# Patient Record
Sex: Male | Born: 1980 | Race: White | Hispanic: No | Marital: Single | Smoking: Never smoker
Health system: Southern US, Community
[De-identification: ages and names within clinical notes are randomized; demographics above are authoritative.]

## PROBLEM LIST (undated history)

## (undated) DIAGNOSIS — E785 Hyperlipidemia, unspecified: Secondary | ICD-10-CM

## (undated) DIAGNOSIS — K219 Gastro-esophageal reflux disease without esophagitis: Secondary | ICD-10-CM

## (undated) DIAGNOSIS — F32A Depression, unspecified: Secondary | ICD-10-CM

## (undated) DIAGNOSIS — F329 Major depressive disorder, single episode, unspecified: Secondary | ICD-10-CM

## (undated) DIAGNOSIS — R0683 Snoring: Principal | ICD-10-CM

## (undated) DIAGNOSIS — F1010 Alcohol abuse, uncomplicated: Secondary | ICD-10-CM

## (undated) DIAGNOSIS — F1011 Alcohol abuse, in remission: Secondary | ICD-10-CM

## (undated) DIAGNOSIS — M1990 Unspecified osteoarthritis, unspecified site: Secondary | ICD-10-CM

## (undated) DIAGNOSIS — F419 Anxiety disorder, unspecified: Secondary | ICD-10-CM

## (undated) DIAGNOSIS — G4710 Hypersomnia, unspecified: Secondary | ICD-10-CM

## (undated) HISTORY — DX: Depression, unspecified: F32.A

## (undated) HISTORY — DX: Snoring: R06.83

## (undated) HISTORY — DX: Alcohol abuse, uncomplicated: F10.10

## (undated) HISTORY — DX: Anxiety disorder, unspecified: F41.9

## (undated) HISTORY — DX: Alcohol abuse, in remission: F10.11

## (undated) HISTORY — DX: Unspecified osteoarthritis, unspecified site: M19.90

## (undated) HISTORY — DX: Major depressive disorder, single episode, unspecified: F32.9

## (undated) HISTORY — DX: Hyperlipidemia, unspecified: E78.5

## (undated) HISTORY — DX: Hypersomnia, unspecified: G47.10

## (undated) HISTORY — DX: Gastro-esophageal reflux disease without esophagitis: K21.9

---

## 2002-11-18 ENCOUNTER — Ambulatory Visit (HOSPITAL_COMMUNITY): Admission: RE | Admit: 2002-11-18 | Discharge: 2002-11-18 | Payer: Self-pay | Admitting: Unknown Physician Specialty

## 2003-11-23 ENCOUNTER — Ambulatory Visit: Payer: Self-pay | Admitting: Gastroenterology

## 2006-07-21 ENCOUNTER — Ambulatory Visit (HOSPITAL_COMMUNITY): Admission: RE | Admit: 2006-07-21 | Discharge: 2006-07-21 | Payer: Self-pay | Admitting: *Deleted

## 2006-10-25 ENCOUNTER — Emergency Department (HOSPITAL_COMMUNITY): Admission: EM | Admit: 2006-10-25 | Discharge: 2006-10-25 | Payer: Self-pay | Admitting: Emergency Medicine

## 2010-08-07 ENCOUNTER — Encounter: Payer: Self-pay | Admitting: Gastroenterology

## 2010-09-11 ENCOUNTER — Encounter: Payer: Self-pay | Admitting: Gastroenterology

## 2010-09-11 ENCOUNTER — Ambulatory Visit (INDEPENDENT_AMBULATORY_CARE_PROVIDER_SITE_OTHER): Payer: BC Managed Care – PPO | Admitting: Gastroenterology

## 2010-09-11 ENCOUNTER — Other Ambulatory Visit (INDEPENDENT_AMBULATORY_CARE_PROVIDER_SITE_OTHER): Payer: BC Managed Care – PPO

## 2010-09-11 VITALS — BP 138/86 | HR 72 | Ht 72.0 in | Wt 227.0 lb

## 2010-09-11 DIAGNOSIS — R109 Unspecified abdominal pain: Secondary | ICD-10-CM

## 2010-09-11 DIAGNOSIS — K219 Gastro-esophageal reflux disease without esophagitis: Secondary | ICD-10-CM

## 2010-09-11 DIAGNOSIS — K921 Melena: Secondary | ICD-10-CM

## 2010-09-11 LAB — CBC WITH DIFFERENTIAL/PLATELET
Basophils Absolute: 0 10*3/uL (ref 0.0–0.1)
Basophils Relative: 0.4 % (ref 0.0–3.0)
Eosinophils Absolute: 0.4 10*3/uL (ref 0.0–0.7)
Eosinophils Relative: 3.8 % (ref 0.0–5.0)
HCT: 44 % (ref 39.0–52.0)
Hemoglobin: 14.8 g/dL (ref 13.0–17.0)
Lymphocytes Relative: 27.2 % (ref 12.0–46.0)
Lymphs Abs: 2.8 10*3/uL (ref 0.7–4.0)
MCHC: 33.6 g/dL (ref 30.0–36.0)
MCV: 90.9 fl (ref 78.0–100.0)
Monocytes Absolute: 0.8 10*3/uL (ref 0.1–1.0)
Monocytes Relative: 8 % (ref 3.0–12.0)
Neutro Abs: 6.3 10*3/uL (ref 1.4–7.7)
Neutrophils Relative %: 60.6 % (ref 43.0–77.0)
Platelets: 201 10*3/uL (ref 150.0–400.0)
RBC: 4.84 Mil/uL (ref 4.22–5.81)
RDW: 12.9 % (ref 11.5–14.6)
WBC: 10.4 10*3/uL (ref 4.5–10.5)

## 2010-09-11 LAB — BASIC METABOLIC PANEL
BUN: 10 mg/dL (ref 6–23)
CO2: 30 mEq/L (ref 19–32)
Calcium: 9.2 mg/dL (ref 8.4–10.5)
Chloride: 106 mEq/L (ref 96–112)
Creatinine, Ser: 1 mg/dL (ref 0.4–1.5)
GFR: 93.01 mL/min (ref >60.00)
Glucose, Bld: 93 mg/dL (ref 70–99)
Potassium: 4.2 mEq/L (ref 3.5–5.1)
Sodium: 143 mEq/L (ref 135–145)

## 2010-09-11 LAB — HEPATIC FUNCTION PANEL
ALT: 43 U/L (ref 0–53)
AST: 32 U/L (ref 0–37)
Albumin: 4.4 g/dL (ref 3.5–5.2)
Alkaline Phosphatase: 96 U/L (ref 39–117)
Bilirubin, Direct: 0.1 mg/dL (ref 0.0–0.3)
Total Bilirubin: 0.7 mg/dL (ref 0.3–1.2)
Total Protein: 7.7 g/dL (ref 6.0–8.3)

## 2010-09-11 LAB — TSH: TSH: 1.08 u[IU]/mL (ref 0.35–5.50)

## 2010-09-11 MED ORDER — GLYCOPYRROLATE 2 MG PO TABS: 2.0000 mg | ORAL_TABLET | Freq: Two times a day (BID) | ORAL | Status: DC

## 2010-09-11 NOTE — Patient Instructions (Addendum)
Go directly to the basement today, for your lab work. You have been scheduled for an Upper Endoscopy and Colonoscopy.  See separate sheet. High Fiber diet information given. SuPrep sample kit given. Your prescription has been sent to your pharmacy.

## 2010-09-11 NOTE — Progress Notes (Addendum)
History of Present Illness: This is a 30 year old male here today for several gastrointestinal complaints. He has a history of alcohol abuse since age 11. He states he has been sober now for approximately 18 months.  He relates a history of alternating diarrhea and constipation with frequent crampy midabdominal pain followed by urgent bowel movements. He has had these symptoms for about 20 years and they have not substantially changed over time. He also has had reflux problems for about 15 years and he has been maintained on omeprazole with good control of his symptoms.  He has occasional small amounts of bright red blood per rectum associated with bowel movements.  He underwent an abdominal ultrasound in 2008 for elevated liver function tests. The abdominal ultrasound was normal. He states his liver function tests returned to normal. He underwent a CT of the abdomen and pelvis in 2004 which was also normal.  Denies weight loss, change in stool caliber, melena, nausea, vomiting, dysphagia, chest pain.  Past Medical History  Diagnosis Date  . Anxiety and depression   . GERD (gastroesophageal reflux disease)   . Hyperlipemia   . Alcohol abuse   . Arthritis    No past surgical history on file.  reports that he has never smoked. He has quit using smokeless tobacco. His smokeless tobacco use included Chew. He reports that he does not drink alcohol or use illicit drugs. family history includes Heart disease in his maternal grandmother. No Known Allergies  Outpatient Encounter Prescriptions as of 09/11/2010  Medication Sig Dispense Refill  . atorvastatin (LIPITOR) 10 MG tablet Take 10 mg by mouth daily.        Marland Kitchen FLUoxetine (PROZAC) 10 MG capsule Take 10 mg by mouth daily.        Marland Kitchen glucosamine-chondroitin 500-400 MG tablet Take 2 tablets by mouth daily.        Marland Kitchen lithium carbonate 150 MG capsule Take 150 mg by mouth every other day.        Marland Kitchen omeprazole (PRILOSEC) 20 MG capsule Take 20 mg by mouth  daily.        Marland Kitchen glycopyrrolate (ROBINUL) 2 MG tablet Take 1 tablet (2 mg total) by mouth 2 (two) times daily.  60 tablet  11   Review of Systems: Pertinent positive and negative review of systems were noted in the above HPI section. All other review of systems were otherwise negative.  Physical Exam: General: Well developed , well nourished, no acute distress Head: Normocephalic and atraumatic Eyes:  sclerae anicteric, EOMI Ears: Normal auditory acuity Mouth: No deformity or lesions Neck: Supple, no masses or thyromegaly Lungs: Clear throughout to auscultation Heart: Regular rate and rhythm; no murmurs, rubs or bruits Abdomen: Soft, non tender and non distended. No masses, hepatosplenomegaly or hernias noted. Normal Bowel sounds Rectal: Deferred to colonoscopy Musculoskeletal: Symmetrical with no gross deformities  Skin: No lesions on visible extremities Pulses:  Normal pulses noted Extremities: No clubbing, cyanosis, edema or deformities noted Neurological: Alert oriented x 4, grossly nonfocal Cervical Nodes:  No significant cervical adenopathy Inguinal Nodes: No significant inguinal adenopathy Psychological:  Alert and cooperative. Normal mood and affect  Assessment and Recommendations:  1. Abdominal pain in multiple sites associated with urgent bowel movements, alternating diarrhea and constipation and small volume hematochezia. I suspect he has irritable bowel syndrome and a benign source of rectal bleeding such as hemorrhoids however inflammatory bowel disease, colorectal neoplasms need to be excluded. Substantially increase dietary fiber and water intake. Begin glycopyrrolate  2 mg twice daily. The risks, benefits, and alternatives to colonoscopy with possible biopsy and possible polypectomy were discussed with the patient and they consent to proceed.   2. GERD. Continue omeprazole and standard antireflux measures. Schedule upper endoscopy to rule out erosive esophagitis esophagus.  The risks, benefits, and alternatives to endoscopy with possible biopsy and possible dilation were discussed with the patient and they consent to proceed.   3. Alcoholism currently in recovery for 18 months.

## 2010-10-08 ENCOUNTER — Ambulatory Visit (AMBULATORY_SURGERY_CENTER): Payer: BC Managed Care – PPO | Admitting: Gastroenterology

## 2010-10-08 ENCOUNTER — Encounter: Payer: Self-pay | Admitting: Gastroenterology

## 2010-10-08 DIAGNOSIS — R109 Unspecified abdominal pain: Secondary | ICD-10-CM

## 2010-10-08 DIAGNOSIS — K921 Melena: Secondary | ICD-10-CM

## 2010-10-08 DIAGNOSIS — K219 Gastro-esophageal reflux disease without esophagitis: Secondary | ICD-10-CM

## 2010-10-08 MED ORDER — SODIUM CHLORIDE 0.9 % IV SOLN: 500.0000 mL | INTRAVENOUS | Status: DC

## 2010-10-08 NOTE — Progress Notes (Signed)
0856-Colonoscopy procedure finished. Transitioning pt to endoscopy position, pt refuses to open mouth to insert bite block. Pt arousable, pt nods head when asked to open mouth wide, pt does not open mouth but clenches teeth together. Vitals stable.  0900-Pt arousable, vitals stable, asked pt to open mouth to insert bite block, pt opened mouth, bite block inserted, sedation began, continued with procedure.

## 2010-10-09 ENCOUNTER — Telehealth: Payer: Self-pay | Admitting: *Deleted

## 2010-10-09 NOTE — Telephone Encounter (Signed)

## 2010-10-31 LAB — ETHANOL: Alcohol, Ethyl (B): 195 — ABNORMAL HIGH

## 2010-10-31 LAB — CBC
HCT: 46.2
Hemoglobin: 15.9
MCHC: 34.5
MCV: 89.4
Platelets: 215
RBC: 5.17
RDW: 13.3
WBC: 11.5 — ABNORMAL HIGH

## 2010-10-31 LAB — BASIC METABOLIC PANEL
BUN: 9
CO2: 25
Calcium: 9.5
Chloride: 108
Creatinine, Ser: 0.91
GFR calc Af Amer: >60
GFR calc non Af Amer: >60
Glucose, Bld: 117 — ABNORMAL HIGH
Potassium: 4.2
Sodium: 141

## 2010-10-31 LAB — URINALYSIS, ROUTINE W REFLEX MICROSCOPIC
Bilirubin Urine: NEGATIVE
Nitrite: NEGATIVE
Specific Gravity, Urine: <1.005 — ABNORMAL LOW
Urobilinogen, UA: 0.2
pH: 5.5

## 2010-10-31 LAB — DIFFERENTIAL
Basophils Absolute: 0
Basophils Relative: 0
Eosinophils Absolute: 0.3
Eosinophils Relative: 3
Lymphocytes Relative: 30
Lymphs Abs: 3.4 — ABNORMAL HIGH
Monocytes Absolute: 0.6
Monocytes Relative: 5
Neutro Abs: 7.1
Neutrophils Relative %: 62

## 2010-10-31 LAB — RAPID URINE DRUG SCREEN, HOSP PERFORMED
Amphetamines: NOT DETECTED
Opiates: NOT DETECTED
Tetrahydrocannabinol: NOT DETECTED

## 2012-01-21 HISTORY — PX: KNEE SURGERY: SHX244

## 2012-01-21 HISTORY — PX: MENISCUS REPAIR: SHX5179

## 2013-01-09 ENCOUNTER — Encounter (HOSPITAL_COMMUNITY): Payer: Self-pay | Admitting: Emergency Medicine

## 2013-01-09 ENCOUNTER — Emergency Department (HOSPITAL_COMMUNITY): Payer: BC Managed Care – PPO

## 2013-01-09 ENCOUNTER — Emergency Department (HOSPITAL_COMMUNITY)
Admission: EM | Admit: 2013-01-09 | Discharge: 2013-01-09 | Disposition: A | Discharge status: Home / Self Care | Payer: BC Managed Care – PPO | Attending: Emergency Medicine | Admitting: Emergency Medicine

## 2013-01-09 DIAGNOSIS — Z79899 Other long term (current) drug therapy: Secondary | ICD-10-CM | POA: Insufficient documentation

## 2013-01-09 DIAGNOSIS — R1032 Left lower quadrant pain: Secondary | ICD-10-CM | POA: Insufficient documentation

## 2013-01-09 DIAGNOSIS — F1010 Alcohol abuse, uncomplicated: Secondary | ICD-10-CM | POA: Insufficient documentation

## 2013-01-09 DIAGNOSIS — E785 Hyperlipidemia, unspecified: Secondary | ICD-10-CM | POA: Insufficient documentation

## 2013-01-09 DIAGNOSIS — F341 Dysthymic disorder: Secondary | ICD-10-CM | POA: Insufficient documentation

## 2013-01-09 DIAGNOSIS — M129 Arthropathy, unspecified: Secondary | ICD-10-CM | POA: Insufficient documentation

## 2013-01-09 DIAGNOSIS — K219 Gastro-esophageal reflux disease without esophagitis: Secondary | ICD-10-CM | POA: Insufficient documentation

## 2013-01-09 DIAGNOSIS — R110 Nausea: Secondary | ICD-10-CM | POA: Insufficient documentation

## 2013-01-09 DIAGNOSIS — R109 Unspecified abdominal pain: Secondary | ICD-10-CM

## 2013-01-09 LAB — CBC WITH DIFFERENTIAL/PLATELET
Basophils Absolute: 0 10*3/uL (ref 0.0–0.1)
Eosinophils Relative: 3 % (ref 0–5)
Lymphocytes Relative: 24 % (ref 12–46)
Lymphs Abs: 2.7 10*3/uL (ref 0.7–4.0)
MCV: 88.6 fL (ref 78.0–100.0)
Neutrophils Relative %: 64 % (ref 43–77)
Platelets: 196 10*3/uL (ref 150–400)
RBC: 4.83 MIL/uL (ref 4.22–5.81)
RDW: 12.4 % (ref 11.5–15.5)
WBC: 11 10*3/uL — ABNORMAL HIGH (ref 4.0–10.5)

## 2013-01-09 LAB — COMPREHENSIVE METABOLIC PANEL
ALT: 24 U/L (ref 0–53)
AST: 20 U/L (ref 0–37)
Albumin: 3.9 g/dL (ref 3.5–5.2)
Alkaline Phosphatase: 101 U/L (ref 39–117)
BUN: 14 mg/dL (ref 6–23)
CO2: 22 mEq/L (ref 19–32)
Calcium: 8.8 mg/dL (ref 8.4–10.5)
Chloride: 106 mEq/L (ref 96–112)
Creatinine, Ser: 1.12 mg/dL (ref 0.50–1.35)
GFR calc Af Amer: >90 mL/min (ref >90)
GFR calc non Af Amer: 84 mL/min — ABNORMAL LOW (ref >90)
Glucose, Bld: 99 mg/dL (ref 70–99)
Potassium: 3.8 mEq/L (ref 3.5–5.1)
Sodium: 140 mEq/L (ref 135–145)
Total Bilirubin: 0.3 mg/dL (ref 0.3–1.2)
Total Protein: 7.3 g/dL (ref 6.0–8.3)

## 2013-01-09 LAB — URINALYSIS, ROUTINE W REFLEX MICROSCOPIC
Leukocytes, UA: NEGATIVE
Nitrite: NEGATIVE
Protein, ur: NEGATIVE mg/dL
Specific Gravity, Urine: 1.027 (ref 1.005–1.030)
Urobilinogen, UA: 1 mg/dL (ref 0.0–1.0)

## 2013-01-09 LAB — LIPASE, BLOOD: Lipase: 25 U/L (ref 11–59)

## 2013-01-09 MED ORDER — HYDROMORPHONE HCL PF 1 MG/ML IJ SOLN
1.0000 mg | Freq: Once | INTRAMUSCULAR | Status: AC
  Administered 2013-01-09: 1 mg via INTRAVENOUS
  Filled 2013-01-09: qty 1

## 2013-01-09 MED ORDER — AMOXICILLIN-POT CLAVULANATE 875-125 MG PO TABS: 1.0000 | ORAL_TABLET | Freq: Two times a day (BID) | ORAL | Status: DC

## 2013-01-09 MED ORDER — ONDANSETRON HCL 4 MG/2ML IJ SOLN
4.0000 mg | Freq: Once | INTRAMUSCULAR | Status: AC
  Administered 2013-01-09: 4 mg via INTRAVENOUS
  Filled 2013-01-09: qty 2

## 2013-01-09 MED ORDER — OXYCODONE-ACETAMINOPHEN 5-325 MG PO TABS: 1.0000 | ORAL_TABLET | ORAL | Status: DC | PRN

## 2013-01-09 MED ORDER — IOHEXOL 300 MG/ML  SOLN
100.0000 mL | Freq: Once | INTRAMUSCULAR | Status: AC | PRN
  Administered 2013-01-09: 100 mL via INTRAVENOUS

## 2013-01-09 MED ORDER — MORPHINE SULFATE 4 MG/ML IJ SOLN
4.0000 mg | Freq: Once | INTRAMUSCULAR | Status: AC
  Administered 2013-01-09: 4 mg via INTRAVENOUS
  Filled 2013-01-09: qty 1

## 2013-01-09 MED ORDER — IOHEXOL 300 MG/ML  SOLN
25.0000 mL | INTRAMUSCULAR | Status: AC
  Administered 2013-01-09: 25 mL via ORAL

## 2013-01-09 NOTE — ED Provider Notes (Signed)
CSN: [id number]     Arrival date & time 01/09/13  1211 History   First MD Initiated Contact with Patient 01/09/13 1347     Chief Complaint  Patient presents with  . Flank Pain    Left flank   (Consider location/radiation/quality/duration/timing/severity/associated sxs/prior Treatment) HPI Comments: Pt state that he started with llq abdominal pain with some radiation to his left flank. No dysuria, vomiting,fever, diarrhea;no history of similar symptoms:pt denies history of stone:pt states that he came in today because the symptoms are worsening:nomal bm with some nausea  The history is provided by the patient. No language interpreter was used.    Past Medical History  Diagnosis Date  . Anxiety and depression   . GERD (gastroesophageal reflux disease)   . Hyperlipemia   . Alcohol abuse   . Arthritis    Past Surgical History  Procedure Laterality Date  . Knee surgery Left    Family History  Problem Relation Age of Onset  . Heart disease Maternal Grandmother    History  Substance Use Topics  . Smoking status: Never Smoker   . Smokeless tobacco: Current User    Types: Chew  . Alcohol Use: No     Comment: Alcohol abuse, recovering 18 months sober    Review of Systems  Constitutional: Negative.   Respiratory: Negative.   Cardiovascular: Negative.     Allergies  Review of patient's allergies indicates no known allergies.  Home Medications   Current Outpatient Rx  Name  Route  Sig  Dispense  Refill  . atorvastatin (LIPITOR) 10 MG tablet   Oral   Take 10 mg by mouth daily.           Marland Kitchen FLUoxetine (PROZAC) 10 MG capsule   Oral   Take 10 mg by mouth daily.           Marland Kitchen glucosamine-chondroitin 500-400 MG tablet   Oral   Take 2 tablets by mouth daily.           Marland Kitchen EXPIRED: glycopyrrolate (ROBINUL) 2 MG tablet   Oral   Take 1 tablet (2 mg total) by mouth 2 (two) times daily.   60 tablet   11   . lithium carbonate 150 MG capsule   Oral   Take 150 mg by  mouth every other day.           . Multiple Vitamin (MULTI VITAMIN MENS PO)   Oral   Take by mouth.           Marland Kitchen omeprazole (PRILOSEC) 20 MG capsule   Oral   Take 20 mg by mouth daily.            BP 137/93  Pulse 82  Temp(Src) 98 F (36.7 C) (Oral)  Resp 18  Wt 230 lb 4.8 oz (104.463 kg)  SpO2 96% Physical Exam  Nursing note and vitals reviewed. Constitutional: He is oriented to person, place, and time. He appears well-developed and well-nourished.  HENT:  Head: Normocephalic and atraumatic.  Cardiovascular: Normal rate and regular rhythm.   Pulmonary/Chest: Effort normal and breath sounds normal.  Abdominal: Soft. Bowel sounds are normal. There is tenderness in the left lower quadrant.  Musculoskeletal: Normal range of motion.  Neurological: He is alert and oriented to person, place, and time.  Skin: Skin is warm and dry.    ED Course  Procedures (including critical care time) Labs Review Labs Reviewed  URINALYSIS, ROUTINE W REFLEX MICROSCOPIC - Abnormal; Notable for the following:  Ketones, ur 15 (*)    All other components within normal limits  CBC WITH DIFFERENTIAL - Abnormal; Notable for the following:    WBC 11.0 (*)    All other components within normal limits  COMPREHENSIVE METABOLIC PANEL - Abnormal; Notable for the following:    GFR calc non Af Amer 84 (*)    All other components within normal limits  LIPASE, BLOOD   Imaging Review Ct Abdomen Pelvis W Contrast  01/09/2013   CLINICAL DATA:  Left flank and left lower quadrant pain.  EXAM: CT ABDOMEN AND PELVIS WITH CONTRAST  TECHNIQUE: Multidetector CT imaging of the abdomen and pelvis was performed using the standard protocol following bolus administration of intravenous contrast.  CONTRAST:  100 mL OMNIPAQUE IOHEXOL 300 MG/ML  SOLN  COMPARISON:  CT abdomen and pelvis 11/18/2002.  FINDINGS: The lung bases are clear. No pleural or pericardial effusion. Heart size is normal.  The gallbladder is  decompressed but otherwise unremarkable. The liver, spleen, adrenal glands, pancreas and kidneys appear normal. There is a small focus of infiltration of omental fat in the left lower quadrant most consistent with inflammatory change. The stomach, small and large bowel and appendix appear normal. There is no lymphadenopathy or fluid collection. No bony abnormality is identified.  IMPRESSION: Small focus of infiltration of omental fat in the left lower quadrant consistent with inflammatory change or less likely omental infarction. The examination is otherwise negative.   Electronically Signed   By: Drusilla Kanner M.D.   On: 01/09/2013 16:51    EKG Interpretation   None       MDM   1. Abdominal pain     Spoke with Dr. Derrell Lolling with surgery and he stated that we could treat with augmentin and have follow up with pcp    Teressa Lower, NP 01/09/13 1944

## 2013-01-09 NOTE — ED Provider Notes (Addendum)
Medical screening examination/treatment/procedure(s) were conducted as a shared visit with non-physician practitioner(s) and myself.  I personally evaluated the patient during the encounter.  EKG Interpretation   None       PAtient with LLQ abd pain. Tender but no signs of peritonitis. CT with inflammatory changes in omentum. D/W general surgery, advised antibiotics and analgesia.      Gilda Crease, MD 01/09/13 1945  Gilda Crease, MD 01/09/13 1946

## 2013-01-09 NOTE — ED Notes (Signed)
Pt c/o sharp left flank pain that moves to left low back and left lower quadrant of abdomen. Pt reports nausea and decrease appetite. Pt reports dark colored urine but no other symptoms.

## 2013-01-09 NOTE — ED Notes (Signed)
Patient finished with oral contrast.

## 2013-01-09 NOTE — ED Notes (Signed)
Patient returned from CT

## 2013-01-09 NOTE — ED Notes (Signed)
Patient transported to CT 

## 2013-09-16 ENCOUNTER — Encounter: Payer: Self-pay | Admitting: *Deleted

## 2013-09-19 ENCOUNTER — Ambulatory Visit (INDEPENDENT_AMBULATORY_CARE_PROVIDER_SITE_OTHER): Payer: BC Managed Care – PPO | Admitting: Neurology

## 2013-09-19 ENCOUNTER — Encounter: Payer: Self-pay | Admitting: Neurology

## 2013-09-19 VITALS — BP 126/83 | HR 69 | Resp 16 | Ht 72.5 in | Wt 229.0 lb

## 2013-09-19 DIAGNOSIS — R0683 Snoring: Secondary | ICD-10-CM

## 2013-09-19 DIAGNOSIS — R0609 Other forms of dyspnea: Secondary | ICD-10-CM

## 2013-09-19 DIAGNOSIS — G4733 Obstructive sleep apnea (adult) (pediatric): Secondary | ICD-10-CM

## 2013-09-19 DIAGNOSIS — R5381 Other malaise: Secondary | ICD-10-CM

## 2013-09-19 DIAGNOSIS — G4730 Sleep apnea, unspecified: Secondary | ICD-10-CM

## 2013-09-19 DIAGNOSIS — R0989 Other specified symptoms and signs involving the circulatory and respiratory systems: Secondary | ICD-10-CM

## 2013-09-19 DIAGNOSIS — R5383 Other fatigue: Secondary | ICD-10-CM

## 2013-09-19 DIAGNOSIS — G4710 Hypersomnia, unspecified: Secondary | ICD-10-CM

## 2013-09-19 DIAGNOSIS — F1011 Alcohol abuse, in remission: Secondary | ICD-10-CM

## 2013-09-19 HISTORY — DX: Snoring: R06.83

## 2013-09-19 HISTORY — DX: Hypersomnia, unspecified: G47.10

## 2013-09-19 HISTORY — DX: Alcohol abuse, in remission: F10.11

## 2013-09-19 MED ORDER — ZALEPLON 10 MG PO CAPS
10.0000 mg | ORAL_CAPSULE | Freq: Every evening | ORAL | Status: DC | PRN
Start: 2013-09-19 — End: 2017-11-23

## 2013-09-19 NOTE — Patient Instructions (Addendum)
Reduce Prozac to every other day before taking this sleep test ,  PSG and MSLT. If apnea is found , AHI of 10 or higher , the MSLT Polysomnography (Sleep Studies) Polysomnography (PSG) is a series of tests used for detecting (diagnosing) obstructive sleep apnea and other sleep disorders. The tests measure how some parts of your body are working while you are sleeping. The tests are extensive and expensive. They are done in a sleep lab or hospital, and vary from center to center. Your caregiver may perform other more simple sleep studies and questionnaires before doing more complete and involved testing. Testing may not be covered by insurance. Some of these tests are:  An EEG (Electroencephalogram). This tests your brain waves and stages of sleep.  An EOG (Electrooculogram). This measures the movements of your eyes. It detects periods of REM (rapid eye movement) sleep, which is your dream sleep.  An EKG (Electrocardiogram). This measures your heart rhythm.  EMG (Electromyography). This is a measurement of how the muscles are working in your upper airway and your legs while sleeping.  An oximetry measurement. It measures how much oxygen (air) you are getting while sleeping.  Breathing efforts may be measured. The same test can be interpreted (understood) differently by different caregivers and centers that study sleep.  Studies may be given an apnea/hypopnea index (AHI). This is a number which is found by counting the times of no breathing or under breathing during the night, and relating those numbers to the amount of time spent in bed. When the AHI is greater than 15, the patient is likely to complain of daytime sleepiness. When the AHI is greater than 30, the patient is at increased risk for heart problems and must be followed more closely. Following the AHI also allows you to know how treatment is working. Simple oximetry (tracking the amount of oxygen that is taken in) can be used for screening  patients who:  Do not have symptoms (problems) of OSA.  Have a normal Epworth Sleepiness Scale Score.  Have a low pre-test probability of having OSA.  Have none of the upper airway problems likely to cause apnea.  Oximetry is also used to determine if treatment is effective in patients who showed significant desaturations (not getting enough oxygen) on their home sleep study. One extra measure of safety is to perform additional studies for the person who only snores. This is because no one can predict with absolute certainty who will have OSA. Those who show significant desaturations (not getting enough oxygen) are recommended to have a more detailed sleep study. Document Released: 07/13/2002 Document Revised: 03/31/2011 Document Reviewed: 03/14/2013 [facility] Patient Information 2015 [place], [place]. This information is not intended to replace advice given to you by your health care provider. Make sure you discuss any questions you have with your health care provider. TEST wil not take place.

## 2013-09-19 NOTE — Progress Notes (Signed)
SLEEP MEDICINE CLINIC   Provider:  Melvyn Novas, M D  Referring Provider: Remus Loffler, PA-C Primary Care Physician:  Remus Loffler, PA-C  Chief Complaint  Patient presents with  . New Evaluation    Room 10  . Sleep consult    HPI:  Kevin Berg is a 33 y.o. male, who  is seen here as a referral  from Dr. Yetta Barre for a sleep apnea evaluation.  [name] reports that he has tired of being tired- he feels extremely fatigued. At work, he operates a Astronomer, so it is important for him to stay alert and awake doing the work hours.  He is a nonsmoker,  he does not drink alcohol,  he has been evaluated for metabolic disorder  and so far has not heard results of his last comprehensive metabolic panel which was drawn on 09-16-13. He also had a CBC drawn at the time. He carries the diagnosis of bipolar depression.  The patient has a supervisory position at [employer], and usually works the early shaft which starts at 7 but can and anytime between 3 and 7 PM. The patient's usual bedtime is between 9 and 10 PM, on average she has a very short sleep latency and sleeps usually through the night. There also some nights each week for he has trouble initiating sleep or sleeping restfully. He does still feel fatigued and not restored or refreshed in the morning nonetheless. His alarm is set at 5 AM and if he can afford it he likes to sleep an extra hour. He has not felt to be ready to go for month now. He feels extremely fatigued in the early afternoon and fights his desire to nap. He drinks 2 pots of coffee, but no energy drinks, Soda in daytime 20 ounces, iced tea on ocasion.  He has one bathroom break at night, he often is diaphoretic in the night, the bed is deranged from tossing and turning. He has vivid dreams and nightmares.  He often struggles with alcohol related dreams, that feel very real to him. No sleep paralysis.  He comes home form work at 4-5 PM , and he is  sleeping in a recliner to nap on week days 45 minutes, on weekends for 2 -3 hours. His wife has witnessed snoring and apneas, mostly in supine. He sleeps usually goes to sleep prone , wakes up on his back .   Review of Systems: Out of a complete 14 system review, the patient complains of only the following symptoms, and all other reviewed systems are negative.  Endorsed  were a sleepiness score at 21 points and the fatigue severity at 61 points. Is a Z scores very elevated. He for the endorsed snoring, joint pain, depression, anxiety, lost interest in activities and   has a history of depression, anxiety , high cholesterol and had a knee surgery in 2014. Depression score elated to ETOH abuse : he quit drinking 90 days ago. Was 3.5 years sober before this spring.      History   Social History  . Marital Status: Married    Spouse Name: Dois Berg    Number of Children: 2  . Years of Education: 12   Occupational History  . [employer]    Social History Main Topics  . Smoking status: Never Smoker   . Smokeless tobacco: Current User    Types: Chew  . Alcohol Use: No     Comment: Alcohol abuse,  recovering 18 months sober  . Drug Use: No  . Sexual Activity: Not on file   Other Topics Concern  . Not on file   Social History Narrative   Patient is married Dois Berg) and lives at home with his wife and two children.   Patient is working at [employer] Teacher, early years/pre).   Patient has a high school education.   Patient is right-handed.   Patient drinks two cups of coffee every morning, two 20 0z of sodas daily and tea not quite as often.    Family History  Problem Relation Age of Onset  . Heart disease Maternal Grandmother   . Diabetes Father   . Hypertension Father   . Hypertension Mother   . Arthritis Mother     Past Medical History  Diagnosis Date  . Anxiety and depression   . GERD (gastroesophageal reflux disease)   . Hyperlipemia   . Alcohol abuse   . Arthritis     Past  Surgical History  Procedure Laterality Date  . Knee surgery Left 2014    Current Outpatient Prescriptions  Medication Sig Dispense Refill  . atorvastatin (LIPITOR) 10 MG tablet Take 10 mg by mouth daily.        Marland Kitchen FLUoxetine (PROZAC) 20 MG capsule Take 20 mg by mouth daily.      Marland Kitchen glucosamine-chondroitin 500-400 MG tablet Take 2 tablets by mouth daily.        Marland Kitchen lithium carbonate 300 MG capsule Take 300 mg by mouth every other day.      . Multiple Vitamin (MULTI VITAMIN MENS PO) Take by mouth.        . naproxen sodium (ANAPROX) 220 MG tablet Take 440 mg by mouth 2 (two) times daily as needed (for pain).      Marland Kitchen omeprazole (PRILOSEC) 20 MG capsule Take 20 mg by mouth 2 (two) times daily before a meal.        No current facility-administered medications for this visit.    Allergies as of 09/19/2013  . (No Known Allergies)    Vitals: BP 126/83  Pulse 69  Resp 16  Ht 6' 0.5" (1.842 m)  Wt 229 lb (103.874 kg)  BMI 30.61 kg/m2 Last Weight:  Wt Readings from Last 1 Encounters:  09/19/13 229 lb (103.874 kg)       Last Height:   Ht Readings from Last 1 Encounters:  09/19/13 6' 0.5" (1.842 m)    Physical exam:  General: The patient is awake, alert and appears not in acute distress. The patient is well groomed. Head: Normocephalic, atraumatic. Neck is supple. Mallampati 3  neck circumference:17.5 . Nasal airflow unrestricted , TMJ is not  evident . Retrognathia is not seen.  Cardiovascular:  Regular rate and rhythm, without  murmurs or carotid bruit, and without distended neck veins. Respiratory: Lungs are clear to auscultation. Skin:  Without evidence of edema, or rash Trunk: BMI is elevated and patient has normal posture.  Neurologic exam : The patient is awake and alert, oriented to place and time.   Memory subjectivedescribed as intact. There is a normal attention span & concentration ability. Speech is fluent without  dysarthria, dysphonia or aphasia. Mood and affect are  appropriate.  Cranial nerves: Pupils are equal and briskly reactive to light. Funduscopic exam without  evidence of pallor or edema. Extraocular movements  in vertical and horizontal planes intact and without nystagmus. Visual fields by finger perimetry are intact. Hearing to finger rub intact.  Facial sensation  intact to fine touch. Facial motor strength is symmetric and tongue and uvula move midline.  Motor exam:   Normal tone ,muscle bulk and symmetric ,strength in all extremities.  Sensory:  Fine touch, pinprick and vibration were tested in all extremities. Proprioception is normal.  Coordination: Rapid alternating movements in the fingers/hands is normal. Finger-to-nose maneuver without evidence of ataxia, dysmetria or tremor.  Gait and station: Patient walks without assistive device and is able unassisted to climb up to the exam table.  Strength within normal limits. Stance is stable and normal.  Deep tendon reflexes: in the  upper and lower extremities are symmetric and intact.   Assessment:  After physical and neurologic examination, review of laboratory studies, imaging, neurophysiology testing and pre-existing records, assessment is   Snoring witnessed apneas, high Epworth and FSS , likely related to OSA in this patient, with a history of depression.    The patient was advised of the nature of the diagnosed sleep disorder , the treatment options and risks for general a health and wellness arising from not treating the condition. Visit duration was 45 minutes.   Plan:  Treatment plan and additional workup : SPLIT at AHI 15 and score at 3 % , Co2 if available.  Needs to bring his aleve to the lab.      Porfirio Mylar Rmoni Keplinger MD  09/19/2013

## 2013-10-27 ENCOUNTER — Ambulatory Visit (INDEPENDENT_AMBULATORY_CARE_PROVIDER_SITE_OTHER): Payer: BC Managed Care – PPO

## 2013-10-27 DIAGNOSIS — R5383 Other fatigue: Secondary | ICD-10-CM

## 2013-10-27 DIAGNOSIS — G4733 Obstructive sleep apnea (adult) (pediatric): Secondary | ICD-10-CM

## 2013-10-27 DIAGNOSIS — R5381 Other malaise: Secondary | ICD-10-CM

## 2013-10-27 DIAGNOSIS — G4730 Sleep apnea, unspecified: Secondary | ICD-10-CM

## 2013-10-27 DIAGNOSIS — R0683 Snoring: Secondary | ICD-10-CM

## 2013-10-27 DIAGNOSIS — G4710 Hypersomnia, unspecified: Secondary | ICD-10-CM

## 2013-10-28 DIAGNOSIS — G4710 Hypersomnia, unspecified: Secondary | ICD-10-CM

## 2013-11-10 ENCOUNTER — Telehealth: Payer: Self-pay | Admitting: *Deleted

## 2013-11-10 ENCOUNTER — Encounter: Payer: Self-pay | Admitting: *Deleted

## 2013-11-10 ENCOUNTER — Other Ambulatory Visit: Payer: Self-pay | Admitting: Neurology

## 2013-11-10 DIAGNOSIS — G4733 Obstructive sleep apnea (adult) (pediatric): Secondary | ICD-10-CM

## 2013-11-10 NOTE — Telephone Encounter (Signed)
Patient was contacted and provided the results of his NPSG and his subsequent MSLT.  Patient was given the option of having an overnight CPAP titration study or being directly referred to the DME for Auto-CPAP.  Patient opted to be directly referred to DME and wants to try using CPAP to see if it resolves his excessive daytime sleepiness.  Patient was mailed a copy of the results and a copy was faxed to Prudy FeelerAngel Jones, PA-C.   Patient instructed to contact our office 6-8 weeks post set up to schedule a follow up appointment.

## 2013-11-14 ENCOUNTER — Other Ambulatory Visit: Payer: Self-pay | Admitting: Neurology

## 2013-11-14 DIAGNOSIS — G4733 Obstructive sleep apnea (adult) (pediatric): Secondary | ICD-10-CM

## 2014-01-16 ENCOUNTER — Ambulatory Visit: Payer: BC Managed Care – PPO | Admitting: Neurology

## 2014-02-04 ENCOUNTER — Encounter: Payer: Self-pay | Admitting: Neurology

## 2014-02-16 ENCOUNTER — Encounter: Payer: Self-pay | Admitting: Neurology

## 2014-02-17 ENCOUNTER — Ambulatory Visit (INDEPENDENT_AMBULATORY_CARE_PROVIDER_SITE_OTHER): Payer: BLUE CROSS/BLUE SHIELD | Admitting: Neurology

## 2014-02-17 ENCOUNTER — Encounter: Payer: Self-pay | Admitting: Neurology

## 2014-02-17 VITALS — BP 122/82 | HR 73 | Resp 14 | Ht 71.0 in | Wt 229.5 lb

## 2014-02-17 DIAGNOSIS — G4730 Sleep apnea, unspecified: Secondary | ICD-10-CM

## 2014-02-17 DIAGNOSIS — G4710 Hypersomnia, unspecified: Secondary | ICD-10-CM

## 2014-02-17 NOTE — Progress Notes (Signed)
SLEEP MED[facility]INE CLINIC   Provider:  Melvyn Novas, M D  Referring Provider: Remus Loffler, PA-C Primary Care Physician:  Remus Loffler, PA-C  Chief Complaint  Patient presents with  . RV cpap    Rm 10, alone    HPI:  Kevin Berg is a 34 y.o. male, who  is seen here as a referral  from Dr. Yetta Barre for a sleep apnea evaluation.  [name] reports that he has tired of being tired- he feels extremely fatigued. At work, he operates a Astronomer, so it is important for him to stay alert and awake doing the work hours.  He is a nonsmoker,  he does not drink alcohol,  he has been evaluated for metabolic disorder  and so far has not heard results of his last comprehensive metabolic panel which was drawn on 09-16-13. He also had a CBC drawn at the time. He carries the diagnosis of bipolar depression.  The patient has a supervisory position at [employer], and usually works the early shaft which starts at 7 but can and anytime between 3 and 7 PM. The patient's usual bedtime is between 9 and 10 PM, on average she has a very short sleep latency and sleeps usually through the night. There also some nights each week for he has trouble initiating sleep or sleeping restfully. He does still feel fatigued and not restored or refreshed in the morning nonetheless. His alarm is set at 5 AM and if he can afford it he likes to sleep an extra hour. He has not felt to be ready to go for month now. He feels extremely fatigued in the early afternoon and fights his desire to nap. He drinks 2 pots of coffee, but no energy drinks, Soda in daytime 20 ounces, iced tea on ocasion.  He has one bathroom break at night, he often is diaphoretic in the night, the bed is deranged from tossing and turning. He has vivid dreams and nightmares.  He often struggles with alcohol related dreams, that feel very real to him. No sleep paralysis.  He comes home form work at 4-5 PM , and he is sleeping in a recliner to  nap on week days 45 minutes, on weekends for 2 -3 hours. His wife has witnessed snoring and apneas, mostly in supine. He sleeps usually goes to sleep prone , wakes up on his back .   1-20 9-16 [name] is here for his first revisit after diagnosis of obstructive sleep apnea. He underwent a polysomnography on 10-27-13 and was diagnosed with a very mild apnea and AHI of 6.4 but an RDI of 14.7 there was a supine component to his apnea as well as REM dependent apnea. The REM AHI was 19.2 the patient did not retain CO2 but had brief periods of October less oxygenation. 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 An O2 titration was recommended as well as the study was found to be valid to the followed by an MS LT his MS LT had a mean sleep latency of 5.9 no REM sleep onset was noted and he fell asleep in all 5 naps. The preceding total sleep time of the night was 401 minutes. This patient is certainly hypersomnia By the brief mean sleep latency and the frequency of going into naps in daytime. The diagnosis of narcolepsy however is usually relying on the onset of REM sleep in  one of these nap periods based on her clinical picture the diagnosis can still be made even if REM onset sleep was not noted. The patient was fitted with an outer titrate her between 5 and 12 cm water and an EPR level of 3 cm was set he has been 83% compliant which is excellent 25 out of 30 days all days was 5 hours and 42 minutes on average use of CPAP and his AHI has been reduced to 0.1. Therefore this patient's mild apnea is considered to be sufficiently treated CPAP is working well for him he endorsed today the Epworth sleepiness score at 8 points which is decreased and the fatigue severity score at 33 points which is also reduced. Based on these to test results I would think that the patient #1 does not have narcolepsy but had etiopathic or sleep apnea related  hypersomnia.  Treatment of his rather mild apnea allowed the patient's patient to have a decrease and fatigue and daytime sleepiness. The patient is compliant with CPAP use. He is allowed to use a sleep aid if needed at night.  Review of Systems:  Out of a complete 14 system review, the patient complains of only the following symptoms, and all other reviewed systems are negative.  Endorsed  were a sleepiness score at  8 from  21 points and the fatigue severity at  34 from 61 points.  He is drinking beer again,      History   Social History  . Marital Status: Married    Spouse Name: Dois DavenportSandra    Number of Children: 2  . Years of Education: 12   Occupational History  . [employer]    Social History Main Topics  . Smoking status: Never Smoker   . Smokeless tobacco: Current User    Types: Chew  . Alcohol Use: 1.8 - 2.4 oz/week    3-4 Not specified per week     Comment: Alcohol abuse, recovering 18 months sober (history)  . Drug Use: No  . Sexual Activity: Not on file   Other Topics Concern  . Not on file   Social History Narrative   Patient is married Dois Davenport( Sandra) and lives at home with his wife and two children.   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   Patient has a high school education.   Patient is right-handed.   Patient drinks two cups of coffee every morning, one 20 0z of sodas daily and tea not quite as often.    Family History  Problem Relation Age of Onset  . Heart disease Maternal Grandmother   . Diabetes Father   . Hypertension Father   . Hypertension Mother   . Arthritis Mother     Past Medical History  Diagnosis Date  . Anxiety and depression   . GERD (gastroesophageal reflux disease)   . Hyperlipemia   . Alcohol abuse   . Arthritis   . 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 Snorings 09/19/2013  . H/O ETOH abuse 09/19/2013    Past Surgical History  Procedure Laterality Date  . Knee surgery Left 2014    Current Outpatient Prescriptions    Medication Sig Dispense Refill  . atorvastatin (LIPITOR) 10 MG tablet Take 10 mg by mouth daily.      Marland Kitchen. FLUoxetine (PROZAC) 20 MG capsule Take 20 mg by mouth daily.    Marland Kitchen. lithium carbonate 300 MG capsule Take 300 mg by mouth every other day.    . Multiple Vitamin (MULTI  VITAMIN MENS PO) Take by mouth.      . naproxen sodium (ANAPROX) 220 MG tablet Take 440 mg by mouth 2 (two) times daily as needed (for pain).    Marland Kitchen omeprazole (PRILOSEC) 20 MG capsule Take 20 mg by mouth 2 (two) times daily before a meal.     . zaleplon (SONATA) 10 MG capsule Take 1 capsule (10 mg total) by mouth at bedtime as needed for sleep. 15 capsule 0   No current facility-administered medications for this visit.    Allergies as of 02/17/2014  . (No Known Allergies)    Vitals: BP 122/82 mmHg  Pulse 73  Resp 14  Ht  (1.803 m)  Wt 229 lb 8 oz (104.101 kg)  BMI 32.02 kg/m2 Last Weight:  Wt Readings from Last 1 Encounters:  02/17/14 229 lb 8 oz (104.101 kg)       Last Height:   Ht Readings from Last 1 Encounters:  02/17/14  (1.803 m)    Physical exam:  General: The patient is awake, alert and appears not in acute distress. The patient is well groomed. Head: Normocephalic, atraumatic. Neck is supple. Mallampati 3 - full facial hair.  neck circumference:17.5 . Nasal airflow unrestricted , TMJ is not  evident . Retrognathia is not seen.  Cardiovascular:  Regular rate and rhythm,  Respiratory: Lungs are clear to auscultation. Skin:  Without evidence of edema, or rash Trunk: BMI is elevated and patient has normal posture.  Neurologic exam : The patient is awake and alert, oriented to place and time.   Memory subjectivedescribed as intact.  Cranial nerves: Pupils are equal and briskly reactive to light. Hearing to finger rub intact.   Facial sensation intact to fine touch. Facial motor strength is symmetric and tongue and uvula move midline.  Motor exam:   Normal tone ,muscle bulk and symmetric  ,strength in all extremities. Gait and station: Patient walks without assistive device and is able unassisted to climb up to the exam table.  Strength within normal limits. Stance is stable and normal.  Deep tendon reflexes: in the  upper and lower extremities are symmetric and intact. Assessment:  After physical and neurologic examination, review of laboratory studies, imaging, neurophysiology testing and pre-existing records, assessment is   Mr. 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 His MS LT study was performed after a night of normal sleep and after he had weaned off Prozac and still did not show sleep REM onsets. His decrease in fatigue and Epworth sleepiness score from 20 01/21/2006 points documents the significance of CPAP treatment for this mild apnea patient. He has been very compliant and I encouraged him to continue using the CPAP. He seems not reminded his 95th percentile pressure is at 8.8 cm water as far as I'm concerned he can stay on the outer titrated. I have advised him to not use alcohol before bedtime as it can still cause interruption in his sleep plus it usually tends to worsen apnea and snoring.   The patient was advised of the nature of the diagnosed sleep disorder , the treatment options and risks for general a health and wellness arising from not treating the condition. Visit duration was 15 minutes.   Plan:   Rv once a Elwyn Lade Ezella Kell MD  02/17/2014

## 2014-08-18 IMAGING — CT CT ABD-PELV W/ CM
2 of 4 series · 16 of 46 positions shown, 18 images · IV contrast (APPLIED)
Comparison: CT abdomen and pelvis 11/18/2002.

CLINICAL DATA: Left flank and left lower quadrant pain.

EXAM:
CT ABDOMEN AND PELVIS WITH CONTRAST
TECHNIQUE: Multidetector CT imaging of the abdomen and pelvis was performed
using the standard protocol following bolus administration of
intravenous contrast.
CONTRAST:  100 mL OMNIPAQUE IOHEXOL 300 MG/ML  SOLN

[Series 2: abd/ pelvis 5.0 i30f 1 · axial · 0.83mm/px · z∈[-574,-84]mm · 13 of 108 slices shown, 15 images]
[im 5/108  soft-tissue]
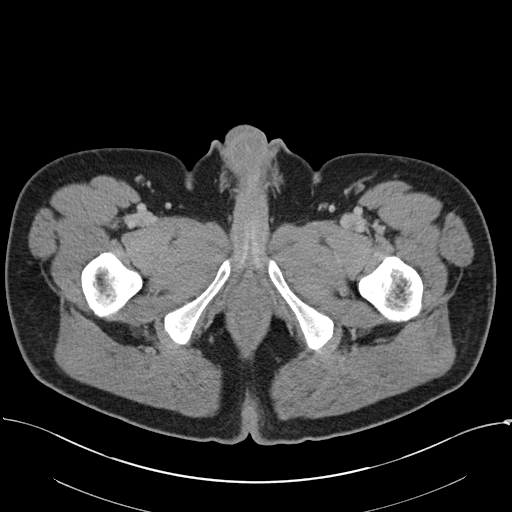
[im 5/108  bone]
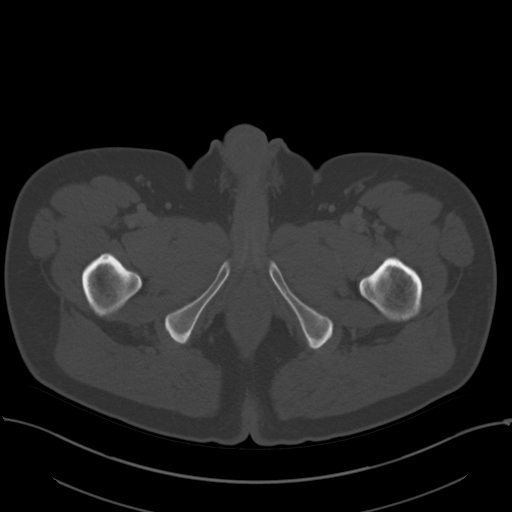
[im 14/108  soft-tissue]
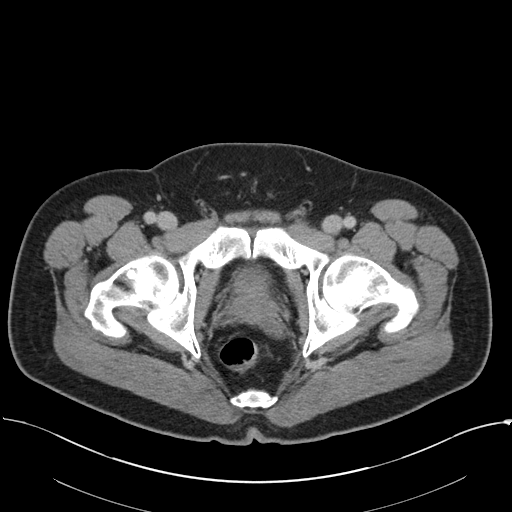
[im 23/108  soft-tissue]
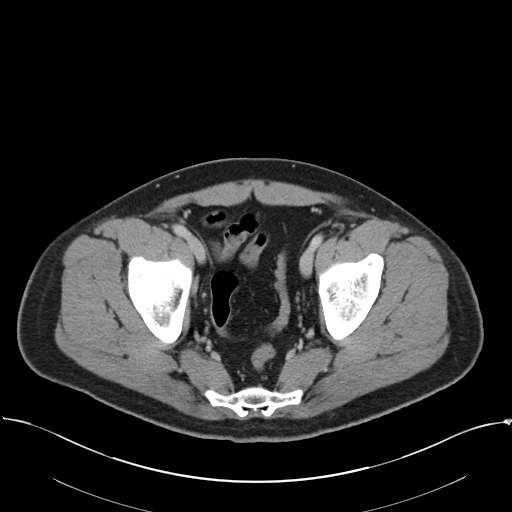
[im 32/108  soft-tissue]
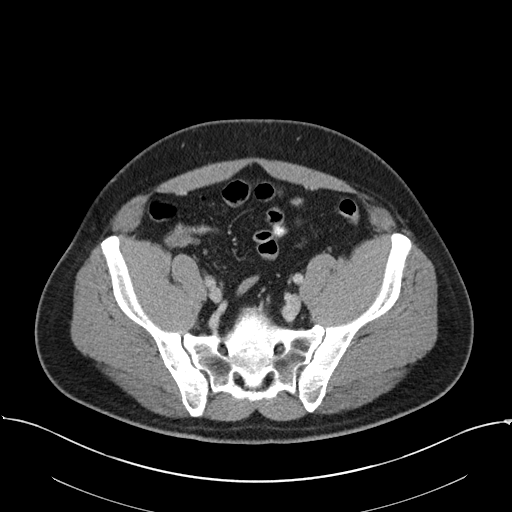
[im 36/108  soft-tissue]
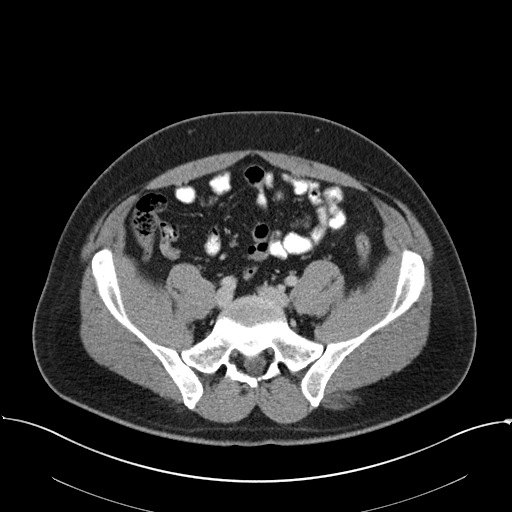
[im 45/108  soft-tissue]
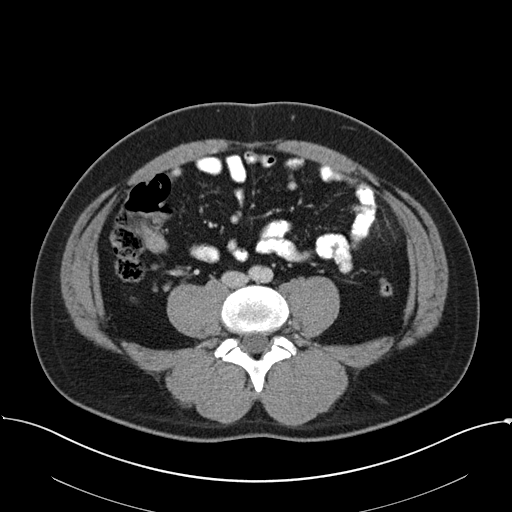
[im 54/108  soft-tissue]
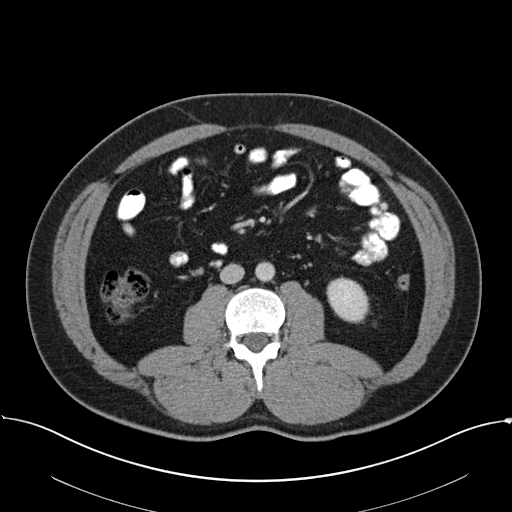
[im 63/108  soft-tissue]
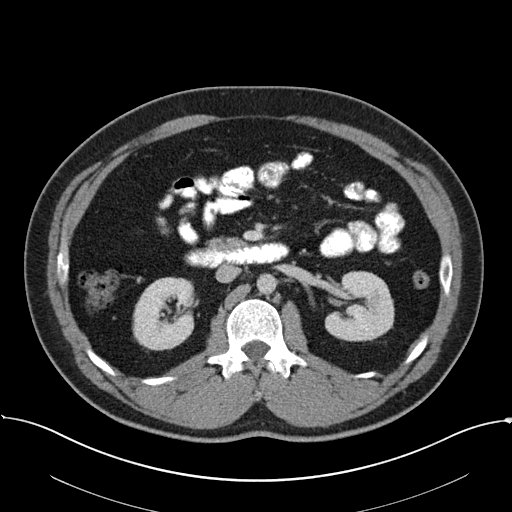
[im 72/108  soft-tissue]
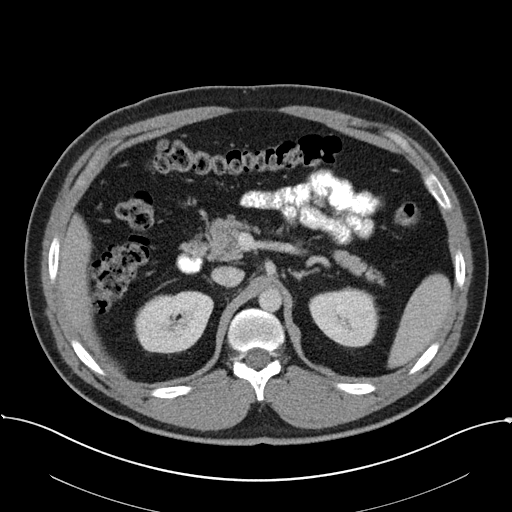
[im 72/108  bone]
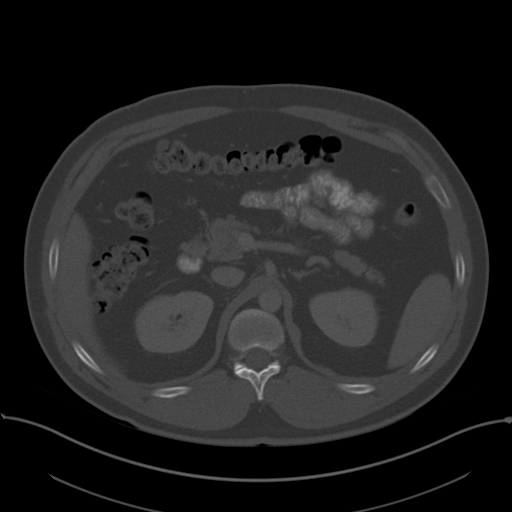
[im 76/108  soft-tissue]
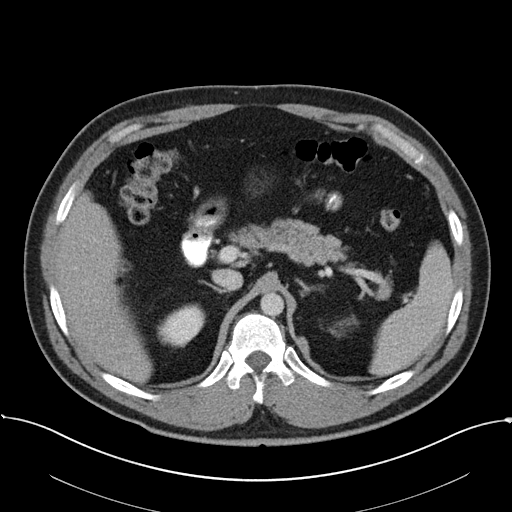
[im 85/108  soft-tissue]
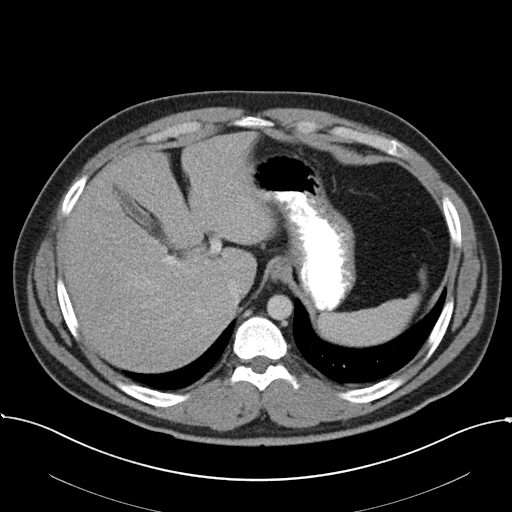
[im 94/108  soft-tissue]
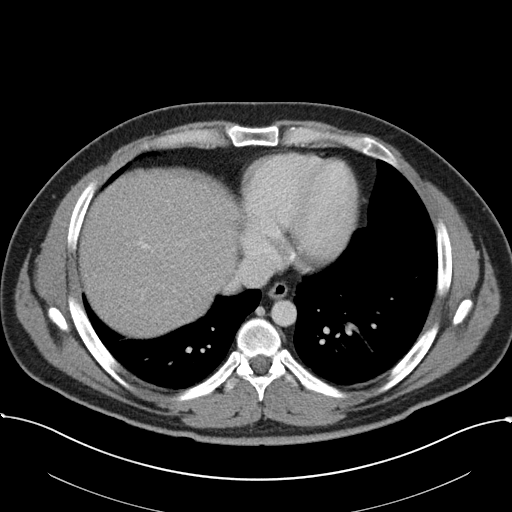
[im 103/108  soft-tissue]
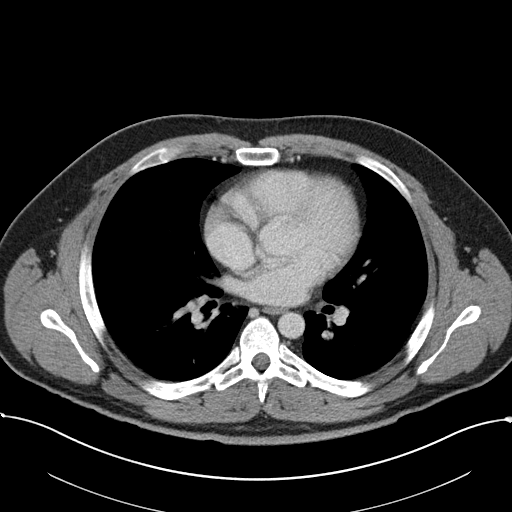

[Series 5: cor · coronal · 0.71mm/px · 3 of 125 slices shown]
[im 42/125  soft-tissue]
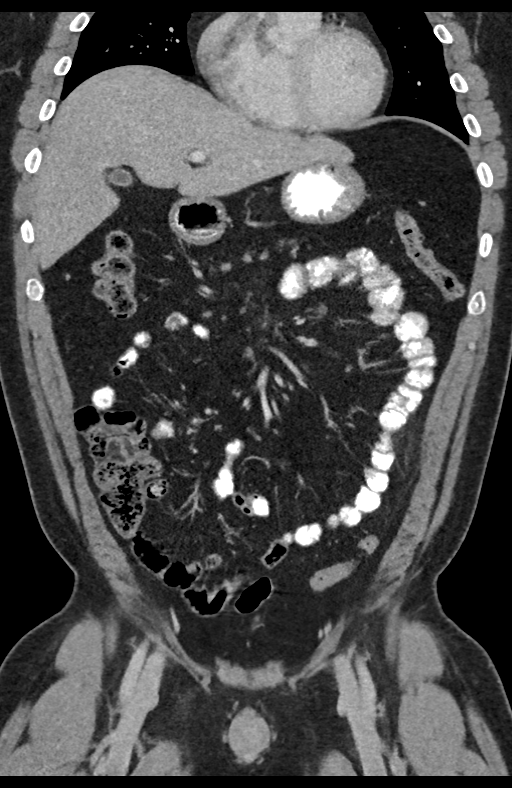
[im 56/125  soft-tissue]
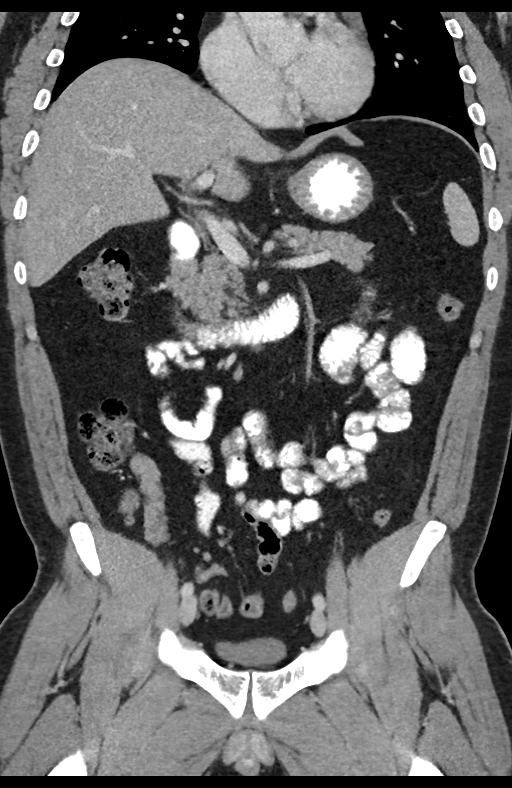
[im 69/125  soft-tissue]
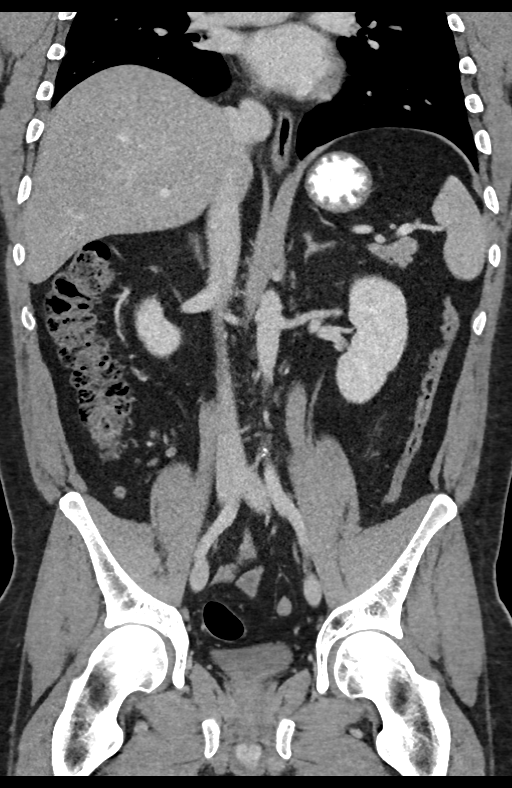

[16 of 46 positions shown; findings below may reference images not displayed]

FINDINGS: The lung bases are clear. No pleural or pericardial effusion. Heart
size is normal.

The gallbladder is decompressed but otherwise unremarkable. The
liver, spleen, adrenal glands, pancreas and kidneys appear normal.
There is a small focus of infiltration of omental fat in the left
lower quadrant most consistent with inflammatory change. The
stomach, small and large bowel and appendix appear normal. There is
no lymphadenopathy or fluid collection. No bony abnormality is
identified.
IMPRESSION: Small focus of infiltration of omental fat in the left lower
quadrant consistent with inflammatory change or less likely omental
infarction. The examination is otherwise negative.

## 2015-02-16 ENCOUNTER — Ambulatory Visit: Payer: BLUE CROSS/BLUE SHIELD | Admitting: Adult Health

## 2017-03-03 ENCOUNTER — Encounter: Payer: Self-pay | Admitting: Neurology

## 2017-03-03 ENCOUNTER — Ambulatory Visit (INDEPENDENT_AMBULATORY_CARE_PROVIDER_SITE_OTHER): Payer: 59 | Admitting: Physician Assistant

## 2017-03-03 ENCOUNTER — Encounter: Payer: Self-pay | Admitting: Physician Assistant

## 2017-03-03 VITALS — BP 129/89 | HR 71 | Temp 97.2°F | Ht 71.0 in | Wt 205.4 lb

## 2017-03-03 DIAGNOSIS — F339 Major depressive disorder, recurrent, unspecified: Secondary | ICD-10-CM | POA: Diagnosis not present

## 2017-03-03 DIAGNOSIS — F1010 Alcohol abuse, uncomplicated: Secondary | ICD-10-CM | POA: Diagnosis not present

## 2017-03-03 DIAGNOSIS — F1011 Alcohol abuse, in remission: Secondary | ICD-10-CM | POA: Insufficient documentation

## 2017-03-03 DIAGNOSIS — Z0000 Encounter for general adult medical examination without abnormal findings: Secondary | ICD-10-CM

## 2017-03-03 MED ORDER — FLUOXETINE HCL 20 MG PO TABS: 20.0000 mg | ORAL_TABLET | Freq: Every day | ORAL | 3 refills | Status: DC

## 2017-03-03 MED ORDER — LITHIUM CARBONATE ER 300 MG PO TBCR: 300.0000 mg | EXTENDED_RELEASE_TABLET | Freq: Two times a day (BID) | ORAL | 1 refills | Status: DC

## 2017-03-03 NOTE — Patient Instructions (Signed)
In a few days you may receive a survey in the mail or online from [employer] regarding your visit with us today. Please take a moment to fill this out. Your feedback is very important to our whole office. It can help us better understand your needs as well as improve your experience and satisfaction. Thank you for taking your time to complete it. We care about you.  Ailea Rhatigan, PA-C  

## 2017-03-03 NOTE — Progress Notes (Signed)
BP 129/89   Pulse 71   Temp (!) 97.2 F (36.2 C) (Oral)   Ht 5' 11"  (1.803 m)   Wt 205 lb 6.4 oz (93.2 kg)   BMI 28.65 kg/m    Subjective:    Patient ID: Kevin Berg, male    DOB: 03-28-1980, 37 y.o.   MRN: [id number]  HPI: Kevin Berg is a 37 y.o. male presenting on 03/03/2017 for New Patient (Initial Visit) (Re-Establish Care)  Patient comes in today for general check and to discuss his depression and alcohol abuse.  He has done very well since 2016 with abstaining from alcohol.  He went to treatment during that time.  In the past month he states he has had some slips and does not want to get back to where he was.  He has had 2 major life changes and that he and his wife are divorced now.  He also lost a job of 18 years because the company moved.  This is been quite devastating to him financially and emotionally.  He had taken Prozac and lithium in the past and tolerated them very well to help with his depression and mood swings.  He is willing to try this again.  Relevant past medical, surgical, family and social history reviewed and updated as indicated. Allergies and medications reviewed and updated.  Past Medical History:  Diagnosis Date  . GERD (gastroesophageal reflux disease)     Past Surgical History:  Procedure Laterality Date  . MENISCUS REPAIR  2014    Review of Systems  Constitutional: Negative.  Negative for appetite change and fatigue.  HENT: Negative.   Eyes: Negative.  Negative for pain and visual disturbance.  Respiratory: Negative.  Negative for cough, chest tightness, shortness of breath and wheezing.   Cardiovascular: Negative.  Negative for chest pain, palpitations and leg swelling.  Gastrointestinal: Negative.  Negative for abdominal pain, diarrhea, nausea and vomiting.  Endocrine: Negative.   Genitourinary: Negative.   Musculoskeletal: Negative.   Skin: Negative.  Negative for color change and rash.  Neurological: Negative.  Negative for  weakness, numbness and headaches.  Psychiatric/Behavioral: Positive for decreased concentration and dysphoric mood. Negative for sleep disturbance.    Allergies as of 03/03/2017   No Known Allergies     Medication List        Accurate as of 03/03/17 11:06 AM. Always use your most recent med list.          FLUoxetine 20 MG tablet Commonly known as:  PROZAC Take 1 tablet (20 mg total) by mouth daily.   lithium carbonate 300 MG CR tablet Commonly known as:  LITHOBID Take 1 tablet (300 mg total) by mouth 2 (two) times daily.   multivitamin with minerals Tabs tablet Take 1 tablet by mouth daily.   omeprazole 20 MG capsule Commonly known as:  PRILOSEC Take 20 mg by mouth daily.          Objective:    BP 129/89   Pulse 71   Temp (!) 97.2 F (36.2 C) (Oral)   Ht 5' 11"  (1.803 m)   Wt 205 lb 6.4 oz (93.2 kg)   BMI 28.65 kg/m   No Known Allergies  Physical Exam  Constitutional: He appears well-developed and well-nourished.  HENT:  Head: Normocephalic and atraumatic.  Eyes: Conjunctivae and EOM are normal. Pupils are equal, round, and reactive to light.  Neck: Normal range of motion. Neck supple.  Cardiovascular: Normal rate, regular rhythm and normal heart sounds.  Pulmonary/Chest: Effort normal and breath sounds normal.  Abdominal: Soft. Bowel sounds are normal.  Musculoskeletal: Normal range of motion.  Skin: Skin is warm and dry.    No results found for this or any previous visit.    Assessment & Plan:   1. Well adult exam - CBC with Differential/Platelet - CMP14+EGFR - Lipid panel - TSH  2. Depression, recurrent ([place]) - lithium carbonate (LITHOBID) 300 MG CR tablet; Take 1 tablet (300 mg total) by mouth 2 (two) times daily.  Dispense: 60 tablet; Refill: 1 - FLUoxetine (PROZAC) 20 MG tablet; Take 1 tablet (20 mg total) by mouth daily.  Dispense: 30 tablet; Refill: 3  3. History of alcohol abuse    Current Outpatient Medications:  .  FLUoxetine  (PROZAC) 20 MG tablet, Take 1 tablet (20 mg total) by mouth daily., Disp: 30 tablet, Rfl: 3 .  lithium carbonate (LITHOBID) 300 MG CR tablet, Take 1 tablet (300 mg total) by mouth 2 (two) times daily., Disp: 60 tablet, Rfl: 1 .  Multiple Vitamin (MULTIVITAMIN WITH MINERALS) TABS tablet, Take 1 tablet by mouth daily., Disp: , Rfl:  .  omeprazole (PRILOSEC) 20 MG capsule, Take 20 mg by mouth daily., Disp: , Rfl:  Continue all other maintenance medications as listed above.  Follow up plan: Return in about 4 weeks (around 03/31/2017).  Educational handout given for [place] PA-C [facility] [address]  [place], [place] [address] [phone]   03/03/2017, 11:06 AM

## 2017-03-04 ENCOUNTER — Other Ambulatory Visit: Payer: Self-pay | Admitting: *Deleted

## 2017-03-04 LAB — CMP14+EGFR
ALK PHOS: 87 IU/L (ref 39–117)
ALT: 24 IU/L (ref 0–44)
AST: 20 IU/L (ref 0–40)
Albumin/Globulin Ratio: 1.5 (ref 1.2–2.2)
Albumin: 4.6 g/dL (ref 3.5–5.5)
BUN/Creatinine Ratio: 9 (ref 9–20)
BUN: 7 mg/dL (ref 6–20)
Bilirubin Total: 0.4 mg/dL (ref 0.0–1.2)
CALCIUM: 9.5 mg/dL (ref 8.7–10.2)
CO2: 21 mmol/L (ref 20–29)
Chloride: 107 mmol/L — ABNORMAL HIGH (ref 96–106)
Creatinine, Ser: 0.81 mg/dL (ref 0.76–1.27)
GFR calc Af Amer: 130 mL/min/{1.73_m2} (ref >59)
GFR, EST NON AFRICAN AMERICAN: 113 mL/min/{1.73_m2} (ref >59)
GLOBULIN, TOTAL: 3 g/dL (ref 1.5–4.5)
GLUCOSE: 103 mg/dL — AB (ref 65–99)
Potassium: 5 mmol/L (ref 3.5–5.2)
SODIUM: 144 mmol/L (ref 134–144)
Total Protein: 7.6 g/dL (ref 6.0–8.5)

## 2017-03-04 LAB — CBC WITH DIFFERENTIAL/PLATELET
BASOS ABS: 0 10*3/uL (ref 0.0–0.2)
Basos: 1 %
EOS (ABSOLUTE): 0.2 10*3/uL (ref 0.0–0.4)
EOS: 2 %
HEMATOCRIT: 47.1 % (ref 37.5–51.0)
Hemoglobin: 15.8 g/dL (ref 13.0–17.7)
IMMATURE GRANULOCYTES: 0 %
Immature Grans (Abs): 0 10*3/uL (ref 0.0–0.1)
LYMPHS ABS: 2.1 10*3/uL (ref 0.7–3.1)
Lymphs: 29 %
MCH: 30.4 pg (ref 26.6–33.0)
MCHC: 33.5 g/dL (ref 31.5–35.7)
MCV: 91 fL (ref 79–97)
MONOS ABS: 0.6 10*3/uL (ref 0.1–0.9)
Monocytes: 8 %
NEUTROS PCT: 60 %
Neutrophils Absolute: 4.4 10*3/uL (ref 1.4–7.0)
PLATELETS: 220 10*3/uL (ref 150–379)
RBC: 5.2 x10E6/uL (ref 4.14–5.80)
RDW: 13.5 % (ref 12.3–15.4)
WBC: 7.2 10*3/uL (ref 3.4–10.8)

## 2017-03-04 LAB — LIPID PANEL
CHOLESTEROL TOTAL: 187 mg/dL (ref 100–199)
Chol/HDL Ratio: 4.5 ratio (ref 0.0–5.0)
HDL: 42 mg/dL (ref >39)
LDL Calculated: 137 mg/dL — ABNORMAL HIGH (ref 0–99)
TRIGLYCERIDES: 41 mg/dL (ref 0–149)
VLDL Cholesterol Cal: 8 mg/dL (ref 5–40)

## 2017-03-04 LAB — TSH: TSH: 0.98 u[IU]/mL (ref 0.450–4.500)

## 2017-03-04 MED ORDER — FLUOXETINE HCL 20 MG PO CAPS: 20.0000 mg | ORAL_CAPSULE | Freq: Every day | ORAL | 0 refills | Status: DC

## 2017-03-04 NOTE — Telephone Encounter (Signed)
Fax received Change from tablets to capsules d/t decreased cost  Prescription sent to pharmacy

## 2017-04-01 ENCOUNTER — Ambulatory Visit: Payer: 59 | Admitting: Physician Assistant

## 2017-04-07 ENCOUNTER — Ambulatory Visit: Payer: 59 | Admitting: Physician Assistant

## 2017-11-23 ENCOUNTER — Ambulatory Visit (INDEPENDENT_AMBULATORY_CARE_PROVIDER_SITE_OTHER): Payer: Managed Care, Other (non HMO) | Admitting: Family Medicine

## 2017-11-23 ENCOUNTER — Encounter: Payer: Self-pay | Admitting: Family Medicine

## 2017-11-23 VITALS — BP 131/74 | HR 83 | Temp 98.1°F | Ht 71.0 in | Wt 214.0 lb

## 2017-11-23 DIAGNOSIS — M5431 Sciatica, right side: Secondary | ICD-10-CM

## 2017-11-23 DIAGNOSIS — Z23 Encounter for immunization: Secondary | ICD-10-CM

## 2017-11-23 DIAGNOSIS — R5383 Other fatigue: Secondary | ICD-10-CM

## 2017-11-23 DIAGNOSIS — Z6829 Body mass index (BMI) 29.0-29.9, adult: Secondary | ICD-10-CM | POA: Diagnosis not present

## 2017-11-23 DIAGNOSIS — K219 Gastro-esophageal reflux disease without esophagitis: Secondary | ICD-10-CM

## 2017-11-23 DIAGNOSIS — E782 Mixed hyperlipidemia: Secondary | ICD-10-CM | POA: Insufficient documentation

## 2017-11-23 DIAGNOSIS — Z0000 Encounter for general adult medical examination without abnormal findings: Secondary | ICD-10-CM | POA: Diagnosis not present

## 2017-11-23 DIAGNOSIS — F339 Major depressive disorder, recurrent, unspecified: Secondary | ICD-10-CM

## 2017-11-23 MED ORDER — PREDNISONE 10 MG (21) PO TBPK: ORAL_TABLET | ORAL | 0 refills | Status: DC

## 2017-11-23 MED ORDER — OMEPRAZOLE 20 MG PO CPDR: 20.0000 mg | DELAYED_RELEASE_CAPSULE | Freq: Every day | ORAL | 3 refills | Status: DC

## 2017-11-23 MED ORDER — NAPROXEN 500 MG PO TABS: 500.0000 mg | ORAL_TABLET | Freq: Two times a day (BID) | ORAL | 1 refills | Status: DC

## 2017-11-23 NOTE — Patient Instructions (Signed)
Food Choices for Gastroesophageal Reflux Disease, Adult When you have gastroesophageal reflux disease (GERD), the foods you eat and your eating habits are very important. Choosing the right foods can help ease your discomfort. What guidelines do I need to follow?  Choose fruits, vegetables, whole grains, and low-fat dairy products.  Choose low-fat meat, fish, and poultry.  Limit fats such as oils, salad dressings, butter, nuts, and avocado.  Keep a food diary. This helps you identify foods that cause symptoms.  Avoid foods that cause symptoms. These may be different for everyone.  Eat small meals often instead of 3 large meals a day.  Eat your meals slowly, in a place where you are relaxed.  Limit fried foods.  Cook foods using methods other than frying.  Avoid drinking alcohol.  Avoid drinking large amounts of liquids with your meals.  Avoid bending over or lying down until 2-3 hours after eating. What foods are not recommended? These are some foods and drinks that may make your symptoms worse: Vegetables Tomatoes. Tomato juice. Tomato and spaghetti sauce. Chili peppers. Onion and garlic. Horseradish. Fruits Oranges, grapefruit, and lemon (fruit and juice). Meats High-fat meats, fish, and poultry. This includes hot dogs, ribs, ham, sausage, salami, and bacon. Dairy Whole milk and chocolate milk. Sour cream. Cream. Butter. Ice cream. Cream cheese. Drinks Coffee and tea. Bubbly (carbonated) drinks or energy drinks. Condiments Hot sauce. Barbecue sauce. Sweets/Desserts Chocolate and cocoa. Donuts. Peppermint and spearmint. Fats and Oils High-fat foods. This includes [place] fries and potato chips. Other Vinegar. Strong spices. This includes black pepper, white pepper, red pepper, cayenne, curry powder, cloves, ginger, and chili powder. The items listed above may not be a complete list of foods and drinks to avoid. Contact your dietitian for more information. This  information is not intended to replace advice given to you by your health care provider. Make sure you discuss any questions you have with your health care provider. Document Released: 07/08/2011 Document Revised: 06/14/2015 Document Reviewed: 11/10/2012 Elsevier Interactive Patient Education  2017 Elsevier Inc. Health Maintenance, Male A healthy lifestyle and preventive care is important for your health and wellness. Ask your health care provider about what schedule of regular examinations is right for you. What should I know about weight and diet? Eat a Healthy Diet  Eat plenty of vegetables, fruits, whole grains, low-fat dairy products, and lean protein.  Do not eat a lot of foods high in solid fats, added sugars, or salt.  Maintain a Healthy Weight Regular exercise can help you achieve or maintain a healthy weight. You should:  Do at least 150 minutes of exercise each week. The exercise should increase your heart rate and make you sweat (moderate-intensity exercise).  Do strength-training exercises at least twice a week.  Watch Your Levels of Cholesterol and Blood Lipids  Have your blood tested for lipids and cholesterol every 5 years starting at 39 years of age. If you are at high risk for heart disease, you should start having your blood tested when you are 39 years old. You may need to have your cholesterol levels checked more often if: ? Your lipid or cholesterol levels are high. ? You are older than 39 years of age. ? You are at high risk for heart disease.  What should I know about cancer screening? Many types of cancers can be detected early and may often be prevented. Lung Cancer  You should be screened every year for lung cancer if: ? You are a current smoker  who has smoked for at least 30 years. ? You are a former smoker who has quit within the past 15 years.  Talk to your health care provider about your screening options, when you should start screening, and how  often you should be screened.  Colorectal Cancer  Routine colorectal cancer screening usually begins at 39 years of age and should be repeated every 5-10 years until you are 39 years old. You may need to be screened more often if early forms of precancerous polyps or small growths are found. Your health care provider may recommend screening at an earlier age if you have risk factors for colon cancer.  Your health care provider may recommend using home test kits to check for hidden blood in the stool.  A small camera at the end of a tube can be used to examine your colon (sigmoidoscopy or colonoscopy). This checks for the earliest forms of colorectal cancer.  Prostate and Testicular Cancer  Depending on your age and overall health, your health care provider may do certain tests to screen for prostate and testicular cancer.  Talk to your health care provider about any symptoms or concerns you have about testicular or prostate cancer.  Skin Cancer  Check your skin from head to toe regularly.  Tell your health care provider about any new moles or changes in moles, especially if: ? There is a change in a mole's size, shape, or color. ? You have a mole that is larger than a pencil eraser.  Always use sunscreen. Apply sunscreen liberally and repeat throughout the day.  Protect yourself by wearing long sleeves, pants, a wide-brimmed hat, and sunglasses when outside.  What should I know about heart disease, diabetes, and high blood pressure?  If you are 16-53 years of age, have your blood pressure checked every 3-5 years. If you are 64 years of age or older, have your blood pressure checked every year. You should have your blood pressure measured twice-once when you are at a hospital or clinic, and once when you are not at a hospital or clinic. Record the average of the two measurements. To check your blood pressure when you are not at a hospital or clinic, you can use: ? An automated blood  pressure machine at a pharmacy. ? A home blood pressure monitor.  Talk to your health care provider about your target blood pressure.  If you are between 38-33 years old, ask your health care provider if you should take aspirin to prevent heart disease.  Have regular diabetes screenings by checking your fasting blood sugar level. ? If you are at a normal weight and have a low risk for diabetes, have this test once every three years after the age of 1. ? If you are overweight and have a high risk for diabetes, consider being tested at a younger age or more often.  A one-time screening for abdominal aortic aneurysm (AAA) by ultrasound is recommended for men aged 65-75 years who are current or former smokers. What should I know about preventing infection? Hepatitis B If you have a higher risk for hepatitis B, you should be screened for this virus. Talk with your health care provider to find out if you are at risk for hepatitis B infection. Hepatitis C Blood testing is recommended for:  Everyone born from [birth year] through 1967.  Anyone with known risk factors for hepatitis C.  Sexually Transmitted Diseases (STDs)  You should be screened each year for STDs including gonorrhea and  chlamydia if: ? You are sexually active and are younger than 39 years of age. ? You are older than 39 years of age and your health care provider tells you that you are at risk for this type of infection. ? Your sexual activity has changed since you were last screened and you are at an increased risk for chlamydia or gonorrhea. Ask your health care provider if you are at risk.  Talk with your health care provider about whether you are at high risk of being infected with HIV. Your health care provider may recommend a prescription medicine to help prevent HIV infection.  What else can I do?  Schedule regular health, dental, and eye exams.  Stay current with your vaccines (immunizations).  Do not use any tobacco  products, such as cigarettes, chewing tobacco, and e-cigarettes. If you need help quitting, ask your health care provider.  Limit alcohol intake to no more than 2 drinks per day. One drink equals 12 ounces of beer, 5 ounces of wine, or 1 ounces of hard liquor.  Do not use street drugs.  Do not share needles.  Ask your health care provider for help if you need support or information about quitting drugs.  Tell your health care provider if you often feel depressed.  Tell your health care provider if you have ever been abused or do not feel safe at home. This information is not intended to replace advice given to you by your health care provider. Make sure you discuss any questions you have with your health care provider. Document Released: 07/05/2007 Document Revised: 09/05/2015 Document Reviewed: 10/10/2014 Elsevier Interactive Patient Education  [employer].

## 2017-11-23 NOTE — Progress Notes (Addendum)
Subjective:    Patient ID: Kevin Berg, male    DOB: 1980-07-28, 37 y.o.   MRN: [id number]  Chief Complaint:  Medical Management of Chronic Issues and Numbness on outisde of right thigh (x 2 months)   HPI: Kevin Berg is a 37 y.o. male presenting on 11/23/2017 for Medical Management of Chronic Issues and Numbness on outisde of right thigh (x 2 months) Pt here for annual physical exam, management of chronic medical issues, and numbness to his right thigh.   1. Mixed hyperlipidemia  Stopped taking Lipitor a long time ago, unsure of date. States he does try to watch what he eats. Denies regular exercise.    2. Gastroesophageal reflux disease without esophagitis  Well controlled with Prilosec. Denies cough, sore throat, or raspy voice. States he does not have symptoms if he takes his medications.    3. Depression, recurrent ([place])  Pt states he was placed on Prozac and Lithium in February 2019, states the took the medications for about a month and then stopped due to side effects. Pt states he feels his depression was situational due to a divorce and loss of employment. States his mood has since improved greatly and he is managing his symptoms without medications. States he does not have thoughts of suicide or homicidal ideations.    Office Visit from 11/23/2017 in [facility]  PHQ-9 Total Score  4      4. Other fatigue  Ongoing fatigue for several months. States he travels a lot for work and is unsure if this is attributing to his fatigue. States he attempts to sleep at least 6 hours per night, but this is hard due to staying in a hotel most of the time.   5. Right sided sciatica  Pt reports paresthesias to his right lateral thigh. States this started 2 months ago and is worse at times. States the pain can be sharp and shooting or dull and aching. States 7/10 at worst. States certain movements aggravate the pain. Denies over the counter remedies. Denies recent  injury.     Relevant past medical, surgical, family, and social history reviewed and updated as indicated.  Allergies and medications reviewed and updated.   Past Medical History:  Diagnosis Date  . Alcohol abuse   . Anxiety and depression   . Arthritis   . GERD (gastroesophageal reflux disease)   . H/O ETOH abuse 09/19/2013  . Hyperlipemia   . 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 Snorings 09/19/2013    Past Surgical History:  Procedure Laterality Date  . KNEE SURGERY Left 2014  . MENISCUS REPAIR  2014    Social History   Socioeconomic History  . Marital status: Single    Spouse name: [name]  . Number of children: 2  . Years of education: 73  . Highest education level: Not on file  Occupational History  . 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 Financial resource strain: Not on file  . Food insecurity:    Worry: Not on file    Inability: Not on file  . Transportation needs:    Medical: Not on file    Non-medical: Not on file  Tobacco Use  . Smoking status: Never Smoker  . Smokeless tobacco: Current User    Types: Chew  Substance and Sexual Activity  . Alcohol use: Yes    Alcohol/week: 24.0 standard drinks    Types: 24 Cans of beer  per week    Frequency: Never  . Drug use: No  . Sexual activity: Not on file  Lifestyle  . Physical activity:    Days per week: Not on file    Minutes per session: Not on file  . Stress: Not on file  Relationships  . Social connections:    Talks on phone: Not on file    Gets together: Not on file    Attends religious service: Not on file    Active member of club or organization: Not on file    Attends meetings of clubs or organizations: Not on file    Relationship status: Not on file  . Intimate partner violence:    Fear of current or ex partner: Not on file    Emotionally abused: Not on file    Physically abused: Not on file    Forced sexual activity: Not on file  Other Topics Concern  . Not  on file  Social History Narrative   ** Merged History Encounter **       Patient is married [name]) and lives at home with his wife and two children. Patient is working at [employer], materials). Patient has a high school education. Patient is right-handed. Patient drinks two cups of coffee every morning, one 20 0z of    sodas daily and tea not quite as often.    Outpatient Encounter Medications as of 11/23/2017  Medication Sig  . Multiple Vitamin (MULTI VITAMIN MENS PO) Take by mouth.    . naproxen sodium (ANAPROX) 220 MG tablet Take 440 mg by mouth 2 (two) times daily as needed (for pain).  Marland Kitchen omeprazole (PRILOSEC) 20 MG capsule Take 1 capsule (20 mg total) by mouth daily.  . [DISCONTINUED] omeprazole (PRILOSEC) 20 MG capsule Take 20 mg by mouth daily.  . naproxen (NAPROSYN) 500 MG tablet Take 1 tablet (500 mg total) by mouth 2 (two) times daily with a meal.  . predniSONE (STERAPRED UNI-PAK 21 TAB) 10 MG (21) TBPK tablet As directed x 6 days  . [DISCONTINUED] atorvastatin (LIPITOR) 10 MG tablet Take 10 mg by mouth daily.    . [DISCONTINUED] FLUoxetine (PROZAC) 20 MG capsule Take 1 capsule (20 mg total) by mouth daily.  . [DISCONTINUED] lithium carbonate (LITHOBID) 300 MG CR tablet Take 1 tablet (300 mg total) by mouth 2 (two) times daily.  . [DISCONTINUED] lithium carbonate 300 MG capsule Take 300 mg by mouth every other day.  . [DISCONTINUED] Multiple Vitamin (MULTIVITAMIN WITH MINERALS) TABS tablet Take 1 tablet by mouth daily.  . [DISCONTINUED] omeprazole (PRILOSEC) 20 MG capsule Take 20 mg by mouth 2 (two) times daily before a meal.   . [DISCONTINUED] zaleplon (SONATA) 10 MG capsule Take 1 capsule (10 mg total) by mouth at bedtime as needed for sleep.   No facility-administered encounter medications on file as of 11/23/2017.     No Known Allergies  Review of Systems  Constitutional: Positive for fatigue. Negative for activity change, chills, fever and unexpected weight change.   HENT: Negative for congestion, sore throat and voice change.   Eyes: Negative for photophobia and visual disturbance.  Respiratory: Negative for cough, choking, chest tightness and shortness of breath.   Cardiovascular: Negative for chest pain, palpitations and leg swelling.  Gastrointestinal: Negative for abdominal distention, abdominal pain, blood in stool, constipation, diarrhea, nausea and vomiting.  Endocrine: Negative for polydipsia, polyphagia and polyuria.  Genitourinary: Negative for difficulty urinating, flank pain and frequency.  Musculoskeletal: Positive for myalgias.  Negative for arthralgias, back pain, gait problem, joint swelling, neck pain and neck stiffness.  Skin: Negative for color change.  Neurological: Positive for numbness. Negative for dizziness, tremors, seizures, syncope, facial asymmetry, speech difficulty, weakness, light-headedness and headaches.  Psychiatric/Behavioral: Positive for dysphoric mood (intermittent ). Negative for agitation, behavioral problems, confusion, decreased concentration, hallucinations, self-injury, sleep disturbance and suicidal ideas. The patient is not nervous/anxious and is not hyperactive.   All other systems reviewed and are negative.       Objective:    BP 131/74   Pulse 83   Temp 98.1 F (36.7 C) (Oral)   Ht _0  (1.803 m)   Wt 214 lb (97.1 kg)   BMI 29.85 kg/m    Wt Readings from Last 3 Encounters:  11/23/17 214 lb (97.1 kg)  03/03/17 205 lb 6.4 oz (93.2 kg)  02/17/14 229 lb 8 oz (104.1 kg)    Physical Exam  Constitutional: He is oriented to person, place, and time. He appears well-developed and well-nourished. He is cooperative. No distress.  HENT:  Head: Normocephalic and atraumatic.  Right Ear: Hearing, tympanic membrane, external ear and ear canal normal.  Left Ear: Hearing, tympanic membrane, external ear and ear canal normal.  Nose: Nose normal.  Mouth/Throat: Oropharynx is clear and moist. No oropharyngeal  exudate.  Eyes: Pupils are equal, round, and reactive to light. Conjunctivae and EOM are normal.  Neck: Normal range of motion. Neck supple. No JVD present. Carotid bruit is not present. No tracheal deviation present. No thyromegaly present.  Cardiovascular: Normal rate, regular rhythm, normal heart sounds and intact distal pulses. Exam reveals no gallop and no friction rub.  No murmur heard. Pulmonary/Chest: Effort normal and breath sounds normal. No respiratory distress.  Abdominal: Soft. Normal appearance and bowel sounds are normal. There is no tenderness.  Musculoskeletal:       Lumbar back: He exhibits normal range of motion, no tenderness, no bony tenderness, no swelling, no edema, no deformity, no laceration, no pain, no spasm and normal pulse.  Positive right straight leg raise test  Lymphadenopathy:    He has no cervical adenopathy.  Neurological: He is alert and oriented to person, place, and time. He has normal strength and normal reflexes. No cranial nerve deficit or sensory deficit.  Skin: Skin is warm, dry and intact. Capillary refill takes less than 2 seconds.  Psychiatric: He has a normal mood and affect. His speech is normal and behavior is normal. Judgment and thought content normal. Cognition and memory are normal.  Nursing note and vitals reviewed.   Results for orders placed or performed in visit on 03/03/17  CBC with Differential/Platelet  Result Value Ref Range   WBC 7.2 3.4 - 10.8 x10E3/uL   RBC 5.20 4.14 - 5.80 x10E6/uL   Hemoglobin 15.8 13.0 - 17.7 g/dL   Hematocrit 47.1 37.5 - 51.0 %   MCV 91 79 - 97 fL   MCH 30.4 26.6 - 33.0 pg   MCHC 33.5 31.5 - 35.7 g/dL   RDW 13.5 12.3 - 15.4 %   Platelets 220 150 - 379 x10E3/uL   Neutrophils 60 Not Estab. %   Lymphs 29 Not Estab. %   Monocytes 8 Not Estab. %   Eos 2 Not Estab. %   Basos 1 Not Estab. %   Neutrophils Absolute 4.4 1.4 - 7.0 x10E3/uL   Lymphocytes Absolute 2.1 0.7 - 3.1 x10E3/uL   Monocytes Absolute  0.6 0.1 - 0.9 x10E3/uL   EOS (ABSOLUTE) 0.2  0.0 - 0.4 x10E3/uL   Basophils Absolute 0.0 0.0 - 0.2 x10E3/uL   Immature Granulocytes 0 Not Estab. %   Immature Grans (Abs) 0.0 0.0 - 0.1 x10E3/uL  CMP14+EGFR  Result Value Ref Range   Glucose 103 (H) 65 - 99 mg/dL   BUN 7 6 - 20 mg/dL   Creatinine, Ser 0.81 0.76 - 1.27 mg/dL   GFR calc non Af Amer 113 >59 mL/min/1.73   GFR calc Af Amer 130 >59 mL/min/1.73   BUN/Creatinine Ratio 9 9 - 20   Sodium 144 134 - 144 mmol/L   Potassium 5.0 3.5 - 5.2 mmol/L   Chloride 107 (H) 96 - 106 mmol/L   CO2 21 20 - 29 mmol/L   Calcium 9.5 8.7 - 10.2 mg/dL   Total Protein 7.6 6.0 - 8.5 g/dL   Albumin 4.6 3.5 - 5.5 g/dL   Globulin, Total 3.0 1.5 - 4.5 g/dL   Albumin/Globulin Ratio 1.5 1.2 - 2.2   Bilirubin Total 0.4 0.0 - 1.2 mg/dL   Alkaline Phosphatase 87 39 - 117 IU/L   AST 20 0 - 40 IU/L   ALT 24 0 - 44 IU/L  Lipid panel  Result Value Ref Range   Cholesterol, Total 187 100 - 199 mg/dL   Triglycerides 41 0 - 149 mg/dL   HDL 42 >39 mg/dL   VLDL Cholesterol Cal 8 5 - 40 mg/dL   LDL Calculated 137 (H) 0 - 99 mg/dL   Chol/HDL Ratio 4.5 0.0 - 5.0 ratio  TSH  Result Value Ref Range   TSH 0.980 0.450 - 4.500 uIU/mL       Pertinent labs & imaging results that were available during my care of the patient were reviewed by me and considered in my medical decision making.  Assessment & Plan:  Aydian was seen today for medical management of chronic issues and numbness on outisde of right thigh.  Diagnoses and all orders for this visit:  Mixed hyperlipidemia Diet and exercise encouraged.  -     Lipid panel  Gastroesophageal reflux disease without esophagitis Well controlled with medications. Preventative measures dicussed.  -     omeprazole (PRILOSEC) 20 MG capsule; Take 1 capsule (20 mg total) by mouth daily.  Depression, recurrent ([place]) Stress management. Return if symptoms return or worsen.   Other fatigue Sleep hygiene discussed.  -      CMP14+EGFR -     CBC with Differential/Platelet -     TSH  Right sided sciatica Strengthening and stretching exercises. Return if symptoms fail to improve with treatment regimen.  -     naproxen (NAPROSYN) 500 MG tablet; Take 1 tablet (500 mg total) by mouth 2 (two) times daily with a meal. -     predniSONE (STERAPRED UNI-PAK 21 TAB) 10 MG (21) TBPK tablet; As directed x 6 days      Continue all other maintenance medications.  Follow up plan: Return in about 6 months (around 05/24/2018), or if symptoms worsen or fail to improve.  Educational handout given for GERD, Health Maintenance.   The above assessment and management plan was discussed with the patient. The patient verbalized understanding of and has agreed to the management plan. Patient is aware to call the clinic if symptoms persist or worsen. Patient is aware when to return to the clinic for a follow-up visit. Patient educated on when it is appropriate to go to the emergency department.   Monia Pouch, FNP-C [facility] [phone]

## 2017-11-24 ENCOUNTER — Telehealth: Payer: Self-pay | Admitting: Family Medicine

## 2017-11-24 DIAGNOSIS — E782 Mixed hyperlipidemia: Secondary | ICD-10-CM

## 2017-11-24 LAB — CBC WITH DIFFERENTIAL/PLATELET
Basophils Absolute: 0 10*3/uL (ref 0.0–0.2)
Basos: 1 %
EOS (ABSOLUTE): 0.2 10*3/uL (ref 0.0–0.4)
Eos: 2 %
Hematocrit: 44.7 % (ref 37.5–51.0)
Hemoglobin: 15.9 g/dL (ref 13.0–17.7)
IMMATURE GRANULOCYTES: 1 %
Immature Grans (Abs): 0 10*3/uL (ref 0.0–0.1)
LYMPHS ABS: 1.7 10*3/uL (ref 0.7–3.1)
Lymphs: 22 %
MCH: 31.5 pg (ref 26.6–33.0)
MCHC: 35.6 g/dL (ref 31.5–35.7)
MCV: 89 fL (ref 79–97)
MONOS ABS: 0.6 10*3/uL (ref 0.1–0.9)
Monocytes: 8 %
NEUTROS PCT: 66 %
Neutrophils Absolute: 5.4 10*3/uL (ref 1.4–7.0)
PLATELETS: 230 10*3/uL (ref 150–450)
RBC: 5.05 x10E6/uL (ref 4.14–5.80)
RDW: 12.4 % (ref 12.3–15.4)
WBC: 8 10*3/uL (ref 3.4–10.8)

## 2017-11-24 LAB — CMP14+EGFR
A/G RATIO: 2 (ref 1.2–2.2)
ALK PHOS: 79 IU/L (ref 39–117)
ALT: 27 IU/L (ref 0–44)
AST: 34 IU/L (ref 0–40)
Albumin: 5.1 g/dL (ref 3.5–5.5)
BILIRUBIN TOTAL: 0.7 mg/dL (ref 0.0–1.2)
BUN / CREAT RATIO: 11 (ref 9–20)
BUN: 10 mg/dL (ref 6–20)
CO2: 21 mmol/L (ref 20–29)
Calcium: 9.5 mg/dL (ref 8.7–10.2)
Chloride: 103 mmol/L (ref 96–106)
Creatinine, Ser: 0.92 mg/dL (ref 0.76–1.27)
GFR calc Af Amer: 121 mL/min/{1.73_m2} (ref >59)
GFR calc non Af Amer: 104 mL/min/{1.73_m2} (ref >59)
GLOBULIN, TOTAL: 2.6 g/dL (ref 1.5–4.5)
Glucose: 95 mg/dL (ref 65–99)
POTASSIUM: 4.3 mmol/L (ref 3.5–5.2)
SODIUM: 139 mmol/L (ref 134–144)
Total Protein: 7.7 g/dL (ref 6.0–8.5)

## 2017-11-24 LAB — LIPID PANEL
Chol/HDL Ratio: 5.4 ratio — ABNORMAL HIGH (ref 0.0–5.0)
Cholesterol, Total: 231 mg/dL — ABNORMAL HIGH (ref 100–199)
HDL: 43 mg/dL (ref >39)
LDL Calculated: 171 mg/dL — ABNORMAL HIGH (ref 0–99)
Triglycerides: 83 mg/dL (ref 0–149)
VLDL Cholesterol Cal: 17 mg/dL (ref 5–40)

## 2017-11-24 LAB — TSH: TSH: 1.21 u[IU]/mL (ref 0.450–4.500)

## 2017-11-24 MED ORDER — ATORVASTATIN CALCIUM 10 MG PO TABS: 10.0000 mg | ORAL_TABLET | Freq: Every day | ORAL | 3 refills | Status: DC

## 2017-11-24 NOTE — Addendum Note (Signed)
Addended by: Sonny Masters on: 11/24/2017 11:47 AM   Modules accepted: Level of Service

## 2017-11-24 NOTE — Telephone Encounter (Signed)
Called concerning elevated cholesterol and need to restart Lipitor, no answer.

## 2017-11-30 ENCOUNTER — Telehealth: Payer: Self-pay | Admitting: Physician Assistant

## 2017-11-30 NOTE — Telephone Encounter (Signed)
Call returned.

## 2018-06-29 ENCOUNTER — Other Ambulatory Visit: Payer: Self-pay

## 2018-06-29 ENCOUNTER — Ambulatory Visit (INDEPENDENT_AMBULATORY_CARE_PROVIDER_SITE_OTHER): Payer: 59 | Admitting: Family Medicine

## 2018-06-29 DIAGNOSIS — K219 Gastro-esophageal reflux disease without esophagitis: Secondary | ICD-10-CM

## 2018-06-29 NOTE — Progress Notes (Signed)
Telephone visit  Subjective: CC: stomach issue PCP: Baruch Gouty, FNP MPN:Kevin Berg is a 38 y.o. male calls for telephone consult today. Patient provides verbal consent for consult held via phone.  Location of patient: home Location of provider: Working remotely from home Others present for call: none  1. Stomach issues Patient reports Sunday night his acid reflux flared really bad.  He notes it was so bad he developed diarrhea and did not sleep well so he called into work Monday.  He takes OTC Omeprazole daily.  He took peptobismol and Tums and symptoms resolved.  He denies nausea, vomiting, fever, abdominal pain, hematochezia, melena.  He is feeling back to normal.  He was told that he had to have a work note to return to work.    ROS: Per HPI  No Known Allergies Past Medical History:  Diagnosis Date  . Alcohol abuse   . Anxiety and depression   . Arthritis   . GERD (gastroesophageal reflux disease)   . H/O ETOH abuse 09/19/2013  . Hyperlipemia   . 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 Snorings 09/19/2013    Current Outpatient Medications:  .  atorvastatin (LIPITOR) 10 MG tablet, Take 1 tablet (10 mg total) by mouth daily., Disp: 90 tablet, Rfl: 3 .  Multiple Vitamin (MULTI VITAMIN MENS PO), Take by mouth.  , Disp: , Rfl:  .  naproxen (NAPROSYN) 500 MG tablet, Take 1 tablet (500 mg total) by mouth 2 (two) times daily with a meal., Disp: 60 tablet, Rfl: 1 .  naproxen sodium (ANAPROX) 220 MG tablet, Take 440 mg by mouth 2 (two) times daily as needed (for pain)., Disp: , Rfl:  .  omeprazole (PRILOSEC) 20 MG capsule, Take 1 capsule (20 mg total) by mouth daily., Disp: 30 capsule, Rfl: 3 .  predniSONE (STERAPRED UNI-PAK 21 TAB) 10 MG (21) TBPK tablet, As directed x 6 days, Disp: 21 tablet, Rfl: 0  Assessment/ Plan: 38 y.o. male   1. Gastroesophageal reflux disease without esophagitis Flare resolved.  His symptoms are now controlled with OTC PPI.  No infectious symptoms or  signs.  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  He may return without restriction.  Follow-up PCP PRN.   Start time: 1:48p End time: 2:00pm  Total time spent on patient care (including telephone call/ virtual visit): 15 minutes  [place], [place] [phone]

## 2018-10-11 ENCOUNTER — Ambulatory Visit: Payer: 59 | Admitting: Family Medicine

## 2018-11-22 ENCOUNTER — Ambulatory Visit (INDEPENDENT_AMBULATORY_CARE_PROVIDER_SITE_OTHER): Payer: Self-pay | Admitting: Family Medicine

## 2018-11-22 ENCOUNTER — Other Ambulatory Visit: Payer: Self-pay

## 2018-11-22 ENCOUNTER — Encounter: Payer: Self-pay | Admitting: Family Medicine

## 2018-11-22 VITALS — BP 118/71 | HR 76 | Temp 98.6°F | Resp 20 | Ht 71.0 in | Wt 202.0 lb

## 2018-11-22 DIAGNOSIS — Z0001 Encounter for general adult medical examination with abnormal findings: Secondary | ICD-10-CM

## 2018-11-22 DIAGNOSIS — F339 Major depressive disorder, recurrent, unspecified: Secondary | ICD-10-CM

## 2018-11-22 DIAGNOSIS — E782 Mixed hyperlipidemia: Secondary | ICD-10-CM

## 2018-11-22 DIAGNOSIS — K219 Gastro-esophageal reflux disease without esophagitis: Secondary | ICD-10-CM

## 2018-11-22 DIAGNOSIS — Z0000 Encounter for general adult medical examination without abnormal findings: Secondary | ICD-10-CM

## 2018-11-22 NOTE — Progress Notes (Signed)
Subjective:  Patient ID: Kevin Berg, male    DOB: 1980/12/04, 38 y.o.   MRN: [id number]  Patient Care Team: Baruch Gouty, FNP as PCP - General (Family Medicine) Terald Sleeper, PA-C (General Practice)   Chief Complaint:  Annual Exam (form for work)   HPI: Kevin Berg is a 38 y.o. male presenting on 11/22/2018 for Annual Exam (form for work)   HPI  Kevin Berg is a 38 yo male who presents for a CPE for insurance. He currently does not have insurance. He denies any concerns. He does not want to have lab work done today since he is self pay.   1. Mixed hyperlipidemia He has not been taking Lipitor. Report muscle pain that started after taking Lipitor and resolved after stopping. 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 Reports his diet is not ideal. He eats whatever is quickest. He does eat some baked food and some vegetables.   2. Depression Denies any symptoms of depression. Denies hopelessness or anhedonia. He is not currently receiving any treatment.    3. GERD Takes omeprazole daily. Reports symptoms are well controlled as long as he take omeprazole.    Relevant past medical, surgical, family, and social history reviewed and updated as indicated.  Allergies and medications reviewed and updated. Date reviewed: Chart in Epic.   Past Medical History:  Diagnosis Date  . Alcohol abuse   . Anxiety and depression   . Arthritis   . GERD (gastroesophageal reflux disease)   . H/O ETOH abuse 09/19/2013  . Hyperlipemia   . 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 Snorings 09/19/2013    Past Surgical History:  Procedure Laterality Date  . KNEE SURGERY Left 2014  . MENISCUS REPAIR  2014    Social History   Socioeconomic History  . Marital status: Single    Spouse name: [name]  . Number of children: 2  . Years of education: 70  . Highest education level: Not on file  Occupational History  . 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 Financial resource strain: Not on file  . Food insecurity    Worry: Not on file    Inability: Not on file  . Transportation needs    Medical: Not on file    Non-medical: Not on file  Tobacco Use  . Smoking status: Never Smoker  . Smokeless tobacco: Current User    Types: Chew  Substance and Sexual Activity  . Alcohol use: Yes    Alcohol/week: 24.0 standard drinks    Types: 24 Cans of beer per week    Frequency: Never  . Drug use: No  . Sexual activity: Not on file  Lifestyle  . Physical activity    Days per week: Not on file    Minutes per session: Not on file  . Stress: Not on file  Relationships  . Social Herbalist on phone: Not on file    Gets together: Not on file    Attends religious service: Not on file    Active member of club or organization: Not on file    Attends meetings of clubs or organizations: Not on file    Relationship status: Not on file  . Intimate partner violence    Fear of current or ex partner: Not on file    Emotionally abused: Not on file    Physically abused: Not on  file    Forced sexual activity: Not on file  Other Topics Concern  . Not on file  Social History Narrative   ** Merged History Encounter **       Patient is married [name]) and lives at home with his wife and two children. Patient is working at [employer] Engineer, materials). Patient has a high school education. Patient is right-handed. Patient drinks two cups of coffee every morning, one 20 0z of    sodas daily and tea not quite as often.    Outpatient Encounter Medications as of 11/22/2018  Medication Sig  . Multiple Vitamin (MULTI VITAMIN MENS PO) Take by mouth.    . naproxen (NAPROSYN) 500 MG tablet Take 1 tablet (500 mg total) by mouth 2 (two) times daily with a meal.  . omeprazole (PRILOSEC) 20 MG capsule Take 1 capsule (20 mg total) by mouth daily.  . [DISCONTINUED] atorvastatin (LIPITOR) 10 MG tablet Take 1 tablet (10 mg total) by mouth  daily.  . [DISCONTINUED] naproxen sodium (ANAPROX) 220 MG tablet Take 440 mg by mouth 2 (two) times daily as needed (for pain).  . [DISCONTINUED] predniSONE (STERAPRED UNI-PAK 21 TAB) 10 MG (21) TBPK tablet As directed x 6 days   No facility-administered encounter medications on file as of 11/22/2018.     No Known Allergies  Review of Systems  Constitutional: Negative for chills, diaphoresis, fatigue, fever and unexpected weight change.  HENT: Negative for congestion, ear pain, sore throat and trouble swallowing.   Eyes: Negative for visual disturbance.  Respiratory: Negative for cough and shortness of breath.   Cardiovascular: Negative for chest pain, palpitations and leg swelling.  Gastrointestinal: Negative for abdominal pain, blood in stool, constipation, diarrhea, nausea and vomiting.  Genitourinary: Negative for decreased urine volume and difficulty urinating.  Musculoskeletal: Negative for arthralgias, joint swelling and myalgias.  Skin: Negative for rash and wound.  Neurological: Negative for dizziness, facial asymmetry, light-headedness, numbness and headaches.  Psychiatric/Behavioral: Negative for confusion, dysphoric mood and sleep disturbance.        Objective:  BP 118/71   Pulse 76   Temp 98.6 F (37 C)   Resp 20   Ht 5' 11"  (1.803 m)   Wt 202 lb (91.6 kg)   SpO2 98%   BMI 28.17 kg/m    Wt Readings from Last 3 Encounters:  11/22/18 202 lb (91.6 kg)  11/23/17 214 lb (97.1 kg)  03/03/17 205 lb 6.4 oz (93.2 kg)    Physical Exam Vitals signs and nursing note reviewed.  Constitutional:      General: He is not in acute distress.    Appearance: Normal appearance. He is not ill-appearing, toxic-appearing or diaphoretic.  HENT:     Head: Normocephalic and atraumatic.     Right Ear: Tympanic membrane, ear canal and external ear normal.     Left Ear: Tympanic membrane, ear canal and external ear normal.     Nose: Nose normal.     Mouth/Throat:     Mouth: Mucous  membranes are moist.     Pharynx: Oropharynx is clear. No oropharyngeal exudate or posterior oropharyngeal erythema.  Eyes:     Extraocular Movements: Extraocular movements intact.     Conjunctiva/sclera: Conjunctivae normal.     Pupils: Pupils are equal, round, and reactive to light.  Neck:     Musculoskeletal: Normal range of motion and neck supple. No muscular tenderness.     Vascular: No carotid bruit.  Cardiovascular:     Rate and  Rhythm: Normal rate and regular rhythm.     Pulses: Normal pulses.     Heart sounds: Normal heart sounds. No murmur.  Pulmonary:     Effort: Pulmonary effort is normal. No respiratory distress.     Breath sounds: Normal breath sounds.  Abdominal:     General: Bowel sounds are normal.     Palpations: Abdomen is soft.     Tenderness: There is no abdominal tenderness.  Musculoskeletal: Normal range of motion.     Right lower leg: No edema.     Left lower leg: No edema.  Skin:    General: Skin is warm and dry.     Capillary Refill: Capillary refill takes less than 2 seconds.  Neurological:     General: No focal deficit present.     Mental Status: He is alert and oriented to person, place, and time. Mental status is at baseline.     Cranial Nerves: No cranial nerve deficit.     Motor: No weakness.     Gait: Gait normal.  Psychiatric:        Mood and Affect: Mood normal.        Behavior: Behavior normal.        Thought Content: Thought content normal.        Judgment: Judgment normal.     Results for orders placed or performed in visit on 11/23/17  CMP14+EGFR  Result Value Ref Range   Glucose 95 65 - 99 mg/dL   BUN 10 6 - 20 mg/dL   Creatinine, Ser 0.92 0.76 - 1.27 mg/dL   GFR calc non Af Amer 104 >59 mL/min/1.73   GFR calc Af Amer 121 >59 mL/min/1.73   BUN/Creatinine Ratio 11 9 - 20   Sodium 139 134 - 144 mmol/L   Potassium 4.3 3.5 - 5.2 mmol/L   Chloride 103 96 - 106 mmol/L   CO2 21 20 - 29 mmol/L   Calcium 9.5 8.7 - 10.2 mg/dL   Total  Protein 7.7 6.0 - 8.5 g/dL   Albumin 5.1 3.5 - 5.5 g/dL   Globulin, Total 2.6 1.5 - 4.5 g/dL   Albumin/Globulin Ratio 2.0 1.2 - 2.2   Bilirubin Total 0.7 0.0 - 1.2 mg/dL   Alkaline Phosphatase 79 39 - 117 IU/L   AST 34 0 - 40 IU/L   ALT 27 0 - 44 IU/L  CBC with Differential/Platelet  Result Value Ref Range   WBC 8.0 3.4 - 10.8 x10E3/uL   RBC 5.05 4.14 - 5.80 x10E6/uL   Hemoglobin 15.9 13.0 - 17.7 g/dL   Hematocrit 44.7 37.5 - 51.0 %   MCV 89 79 - 97 fL   MCH 31.5 26.6 - 33.0 pg   MCHC 35.6 31.5 - 35.7 g/dL   RDW 12.4 12.3 - 15.4 %   Platelets 230 150 - 450 x10E3/uL   Neutrophils 66 Not Estab. %   Lymphs 22 Not Estab. %   Monocytes 8 Not Estab. %   Eos 2 Not Estab. %   Basos 1 Not Estab. %   Neutrophils Absolute 5.4 1.4 - 7.0 x10E3/uL   Lymphocytes Absolute 1.7 0.7 - 3.1 x10E3/uL   Monocytes Absolute 0.6 0.1 - 0.9 x10E3/uL   EOS (ABSOLUTE) 0.2 0.0 - 0.4 x10E3/uL   Basophils Absolute 0.0 0.0 - 0.2 x10E3/uL   Immature Granulocytes 1 Not Estab. %   Immature Grans (Abs) 0.0 0.0 - 0.1 x10E3/uL  Lipid panel  Result Value Ref Range   Cholesterol, Total 231 (  H) 100 - 199 mg/dL   Triglycerides 83 0 - 149 mg/dL   HDL 43 >39 mg/dL   VLDL Cholesterol Cal 17 5 - 40 mg/dL   LDL Calculated 171 (H) 0 - 99 mg/dL   Chol/HDL Ratio 5.4 (H) 0.0 - 5.0 ratio  TSH  Result Value Ref Range   TSH 1.210 0.450 - 4.500 uIU/mL       Pertinent labs & imaging results that were available during my care of the patient were reviewed by me and considered in my medical decision making.  Assessment & Plan:  Kevin Berg was seen today for annual exam.  Diagnoses and all orders for this visit:  Annual physical exam Patient declined lab work today since he is self pay. He stated he will return when his insurance becomes effective for lab work.   Mixed hyperlipidemia Diet encouraged - increase intake of fresh fruits and vegetables, increase intake of lean proteins. Bake, broil, or grill foods. Avoid fried,  greasy, and fatty foods. Avoid fast foods. Increase intake of fiber-rich whole grains. Exercise encouraged - at least 150 minutes per week and advance as tolerated.  Goal BMI < 25. Continue medications as prescribed. Follow up in 3-6 months as discussed.   Gastroesophageal reflux disease without esophagitis Symptoms well controlled with omeprazole. Report new or worsening symptoms.    Depression, recurrent ([place]) Stress management. Report new or worsening symptoms.   Continue all other maintenance medications.  Follow up plan: Return in about 1 year (around 11/22/2019), or if symptoms worsen or fail to improve, for Annual Physical .  Continue healthy lifestyle choices, including diet (rich in fruits, vegetables, and lean proteins, and low in salt and simple carbohydrates) and exercise (at least 30 minutes of moderate physical activity daily).  Educational handout given for high cholesterol.  The above assessment and management plan was discussed with the patient. The patient verbalized understanding of and has agreed to the management plan. Patient is aware to call the clinic if they develop any new symptoms or if symptoms persist or worsen. Patient is aware when to return to the clinic for a follow-up visit. Patient educated on when it is appropriate to go to the emergency department.   Marjorie Smolder, RN, BSN, FNP student [facility] [phone]  I personally was present during the history, physical exam, and medical decision-making activities of this service and have verified that the service and findings are accurately documented in the nurse practitioner student's note.  Monia Pouch, FNP-C [facility] [address] [place], [place] [address] [phone]

## 2018-11-22 NOTE — Patient Instructions (Signed)

## 2018-12-06 ENCOUNTER — Telehealth: Payer: Self-pay | Admitting: Physician Assistant

## 2018-12-06 DIAGNOSIS — K219 Gastro-esophageal reflux disease without esophagitis: Secondary | ICD-10-CM

## 2018-12-06 DIAGNOSIS — R0602 Shortness of breath: Secondary | ICD-10-CM

## 2018-12-06 DIAGNOSIS — R531 Weakness: Secondary | ICD-10-CM

## 2018-12-06 NOTE — Progress Notes (Signed)
Thank you for your e-visit. We are sorry that you are feeling poorly.   I understand that you feel like your symptoms are related to your acid reflux. You may try over the counter Pepcid in additional to your omeprazole.  However, I am concerned that you say you are feeling short of breath and having weakness. Feeling weak is not typically a sign of heartburn. Based on what you shared with me, I feel your condition warrants further evaluation and I recommend that you be seen for a face to face office visit.  NOTE: If you entered your credit card information for this eVisit, you will not be charged. You may see a "hold" on your card for the $35 but that hold will drop off and you will not have a charge processed.  If you are having a true medical emergency please call 911.     For an urgent face to face visit, [place] has four urgent care centers for your convenience:    NEW:  [facility] [place]      https://www.google.com/maps/dir/?[email or web address]&destination=[facility]%2c+[address]+[place]%2c+[place]+[address]                                                   [place], [place] [address] .  Monday - Friday 10 am - 6 pm    . [facility]    [phone]                  Get Driving Directions  [address] [place], [place] [address] . 10 am to 8 pm Monday-Friday . 12 pm to 8 pm Saturday-Sunday   . [facility] [place]                  Get Driving Directions  [address], [place], [place] [address] . 8 am to 8 pm Monday-Friday . 9 am to 6 pm Saturday . 11 am to 6 pm Sunday     . [facility] at [place]                  Get Driving Directions   [address], [place] [address] . 8 am to 8 pm Monday-Friday . 8 am to 4 pm Saturday-Sunday    . [facility] at [place]                    Get Driving Directions  [phone]  [address], [place], [place] [address]  . Monday-Friday, 12 PM to 6 PM    Your e-visit answers were reviewed by a board certified advanced clinical practitioner to complete your personal care plan.  Thank you for using e-Visits.  Greater than 5 minutes, yet less than 10 minutes of time have been spend researching, coordinating, and implementing care for this patient today.

## 2018-12-07 ENCOUNTER — Encounter: Payer: Self-pay | Admitting: Family

## 2018-12-07 ENCOUNTER — Ambulatory Visit (INDEPENDENT_AMBULATORY_CARE_PROVIDER_SITE_OTHER): Payer: 59 | Admitting: Family

## 2018-12-07 DIAGNOSIS — K2100 Gastro-esophageal reflux disease with esophagitis, without bleeding: Secondary | ICD-10-CM | POA: Diagnosis not present

## 2018-12-07 DIAGNOSIS — F1011 Alcohol abuse, in remission: Secondary | ICD-10-CM

## 2018-12-07 MED ORDER — PANTOPRAZOLE SODIUM 20 MG PO TBEC: 20.0000 mg | DELAYED_RELEASE_TABLET | Freq: Every day | ORAL | 1 refills | Status: DC

## 2018-12-07 NOTE — Progress Notes (Signed)
   Virtual Visit via telephone Note Due to COVID-19 pandemic this visit was conducted virtually. This visit type was conducted due to national recommendations for restrictions regarding the COVID-19 Pandemic (e.g. social distancing, sheltering in place) in an effort to limit this patient's exposure and mitigate transmission in our community. All issues noted in this document were discussed and addressed.  A physical exam was not performed with this format.  I connected with Kevin Berg on 12/07/18 at 11:45 AM by telephone and verified that I am speaking with the correct person using two identifiers. Kevin Berg is currently located at home and no one is currently with him  during visit. The provider, Evelina Dun, FNP is located in their office at time of visit.  I discussed the limitations, risks, security and privacy concerns of performing an evaluation and management service by telephone and the availability of in person appointments. I also discussed with the patient that there may be a patient responsible charge related to this service. The patient expressed understanding and agreed to proceed.   History and Present Illness:  Gastroesophageal Reflux He complains of belching, early satiety, heartburn, a hoarse voice and nausea. He reports no abdominal pain or no dysphagia. This is a chronic problem. The current episode started more than 1 year ago. The problem occurs frequently. The problem has been gradually worsening. The heartburn is of moderate intensity. The heartburn wakes him from sleep. The heartburn limits his activity. The heartburn changes with position. The symptoms are aggravated by certain foods, stress and bending.      Review of Systems  HENT: Positive for hoarse voice.   Gastrointestinal: Positive for heartburn and nausea. Negative for abdominal pain and dysphagia.     Observations/Objective: No SOB or distress noted   Assessment and Plan: 1. Gastroesophageal  reflux disease with esophagitis, unspecified whether hemorrhage Change Omeprazole to Protonix -Diet discussed- Avoid fried, spicy, citrus foods, caffeine and alcohol -Do not eat 2-3 hours before bedtime -Encouraged small frequent meals -Avoid NSAID's -Stress management  GI referral pending - pantoprazole (PROTONIX) 20 MG tablet; Take 1 tablet (20 mg total) by mouth daily.  Dispense: 90 tablet; Refill: 1 - Ambulatory referral to Gastroenterology  2. H/O ETOH abuse - Ambulatory referral to Gastroenterology     I discussed the assessment and treatment plan with the patient. The patient was provided an opportunity to ask questions and all were answered. The patient agreed with the plan and demonstrated an understanding of the instructions.   The patient was advised to call back or seek an in-person evaluation if the symptoms worsen or if the condition fails to improve as anticipated.  The above assessment and management plan was discussed with the patient. The patient verbalized understanding of and has agreed to the management plan. Patient is aware to call the clinic if symptoms persist or worsen. Patient is aware when to return to the clinic for a follow-up visit. Patient educated on when it is appropriate to go to the emergency department.   Time call ended:  12:02 pm  I provided 17 minutes of non-face-to-face time during this encounter.    Evelina Dun, FNP

## 2018-12-10 ENCOUNTER — Encounter: Payer: Self-pay | Admitting: Physician Assistant

## 2019-01-04 ENCOUNTER — Ambulatory Visit (INDEPENDENT_AMBULATORY_CARE_PROVIDER_SITE_OTHER): Payer: Managed Care, Other (non HMO) | Admitting: Physician Assistant

## 2019-01-04 ENCOUNTER — Encounter: Payer: Self-pay | Admitting: Physician Assistant

## 2019-01-04 VITALS — BP 124/84 | HR 86 | Temp 98.9°F | Ht 71.0 in | Wt 202.0 lb

## 2019-01-04 DIAGNOSIS — R110 Nausea: Secondary | ICD-10-CM | POA: Diagnosis not present

## 2019-01-04 DIAGNOSIS — K219 Gastro-esophageal reflux disease without esophagitis: Secondary | ICD-10-CM

## 2019-01-04 MED ORDER — PANTOPRAZOLE SODIUM 40 MG PO TBEC: 40.0000 mg | DELAYED_RELEASE_TABLET | Freq: Every day | ORAL | 2 refills | Status: DC

## 2019-01-04 NOTE — Progress Notes (Signed)
Chief Complaint: GERD  HPI:    Mr. Kevin Berg is a 38 year old Caucasian male with a past medical history as listed below, known to Dr. Russella Dar, who was referred to me by Junie Spencer, FNP for a complaint of GERD.      10/08/2010 EGD was normal.  Colonoscopy with external hemorrhoids and otherwise normal.    Today, the patient presents to clinic and explains that for the past year he has had an increase in reflux symptoms and over the past 6 months it has been even worse.  Tells me he almost constantly feels reflux in his throat and a burning in there.  His Omeprazole 20 mg was changed to Protonix about a month ago and he is taking 20 mg when he wakes up in the morning.  Tells me this maybe helps a little bit but has not completely gotten rid of his symptoms.  He has decreased his intake of spicy food but continues to drink sometimes a sixpack of beer at night.  Also complains of nausea here and there and a pressure up in his chest when he experiences reflux symptoms.  Also tells me that he sometimes uses 4 or more ibuprofen a day for "bad knees".  He has been doing this for years.    Denies fever, chills, weight loss, change in bowel habits, vomiting or symptoms that awaken him from sleep.  Past Medical History:  Diagnosis Date  . Alcohol abuse   . Anxiety and depression   . Arthritis   . GERD (gastroesophageal reflux disease)   . H/O ETOH abuse 09/19/2013  . Hyperlipemia   . 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 Snorings 09/19/2013    Past Surgical History:  Procedure Laterality Date  . KNEE SURGERY Left 2014  . MENISCUS REPAIR  2014    Current Outpatient Medications  Medication Sig Dispense Refill  . Multiple Vitamin (MULTI VITAMIN MENS PO) Take by mouth.      . naproxen (NAPROSYN) 500 MG tablet Take 1 tablet (500 mg total) by mouth 2 (two) times daily with a meal. 60 tablet 1  . pantoprazole (PROTONIX) 20 MG tablet Take 1 tablet (20 mg total) by mouth daily. 90 tablet 1   No  current facility-administered medications for this visit.    Allergies as of 01/04/2019  . (No Known Allergies)    Family History  Problem Relation Age of Onset  . Diabetes Father   . Post-traumatic stress disorder Brother   . Heart disease Maternal Grandmother   . Hypertension Father   . Hypertension Mother   . Arthritis Mother     Social History   Socioeconomic History  . Marital status: Single    Spouse name: Kevin Berg  . Number of children: 2  . Years of education: 46  . Highest education level: Not on file  Occupational History  . Occupation: Therapist, art: [employer]  Tobacco Use  . Smoking status: Never Smoker  . Smokeless tobacco: Current User    Types: Chew  Substance and Sexual Activity  . Alcohol use: Yes    Alcohol/week: 24.0 standard drinks    Types: 24 Cans of beer per week  . Drug use: No  . Sexual activity: Not on file  Other Topics Concern  . Not on file  Social History Narrative   ** Merged History Encounter **       Patient is married Kevin Berg) and lives at home with his wife and two  children. 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 Patient has a high school education. Patient is right-handed. Patient drinks two cups of coffee every morning, one 20 0z of    sodas daily and tea not quite as often.   Social Determinants of Health   Financial Resource Strain:   . Difficulty of Paying Living Expenses: Not on file  Food Insecurity:   . Worried About Programme researcher, broadcasting/film/videounning Out of Food in the Last Year: Not on file  . Ran Out of Food in the Last Year: Not on file  Transportation Needs:   . Lack of Transportation (Medical): Not on file  . Lack of Transportation (Non-Medical): Not on file  Physical Activity:   . Days of Exercise per Week: Not on file  . Minutes of Exercise per Session: Not on file  Stress:   . Feeling of Stress : Not on file  Social Connections:   . Frequency of Communication with Friends and Family: Not on file  .  Frequency of Social Gatherings with Friends and Family: Not on file  . Attends Religious Services: Not on file  . Active Member of Clubs or Organizations: Not on file  . Attends BankerClub or Organization Meetings: Not on file  . Marital Status: Not on file  Intimate Partner Violence:   . Fear of Current or Ex-Partner: Not on file  . Emotionally Abused: Not on file  . Physically Abused: Not on file  . Sexually Abused: Not on file    Review of Systems:    Constitutional: No weight loss, fever or chills Skin: No rash  Cardiovascular: No chest pain  Respiratory: No SOB  Gastrointestinal: See HPI and otherwise negative Genitourinary: No dysuria Neurological: No headache Musculoskeletal: No new muscle or joint pain Hematologic: No bleeding  Psychiatric: No history of depression or anxiety   Physical Exam:  Vital signs: BP 124/84 (BP Location: Left Arm, Patient Position: Sitting)   Pulse 86   Temp 98.9 F (37.2 C)   Ht 5\' 11"  (1.803 m)   Wt 202 lb (91.6 kg)   SpO2 98%   BMI 28.17 kg/m   Constitutional:   Pleasant Caucasian male appears to be in NAD, Well developed, Well nourished, alert and cooperative Head:  Normocephalic and atraumatic. Eyes:   PEERL, EOMI. No icterus. Conjunctiva pink. Ears:  Normal auditory acuity. Neck:  Supple Throat: Oral cavity and pharynx without inflammation, swelling or lesion.  Respiratory: Respirations even and unlabored. Lungs clear to auscultation bilaterally.   No wheezes, crackles, or rhonchi.  Cardiovascular: Normal S1, S2. No MRG. Regular rate and rhythm. No peripheral edema, cyanosis or pallor.  Gastrointestinal:  Soft, nondistended, nontender. No rebound or guarding. Normal bowel sounds. No appreciable masses or hepatomegaly. Rectal:  Not performed.  Msk:  Symmetrical without gross deformities. Without edema, no deformity or joint abnormality.  Neurologic:  Alert and  oriented x4;  grossly normal neurologically.  Skin:   Dry and intact without  significant lesions or rashes. Psychiatric: Demonstrates good judgement and reason without abnormal affect or behaviors.  No recent labs or imaging.  Assessment: 1.  GERD: Chronic for the patient but increased over the past 6 months, does sometimes drink a sixpack of beer at night, multiple days a week, also uses sometimes up to 4 Ibuprofen a day on an almost daily basis for bad knees, some help from change from Omeprazole to Pantoprazole 20 mg daily over the past month; concern for gastritis+/-PUD 2.  Nausea: With above  Plan: 1.  Scheduled patient for an EGD in the [place] with Dr. Fuller Plan.  Did discuss risks, benefits, limitations and alternatives and patient agrees to proceed.  Patient will be Covid tested 2 days prior to time of procedure. 2.  Increased Pantoprazole to 40 mg daily, 30-60 minutes before breakfast.  Also sent in a prescription #30 with 3 refills.  Instructed the patient that he can use two 20 mg tabs until he runs out of his current prescription. 3.  Reviewed antireflux diet and lifestyle modifications.  Reiterated that alcohol, caffeine and NSAIDs can all cause symptoms to worsen. 4.  Patient to follow in clinic per recommendations from Dr. Fuller Plan after time of procedure.  Ellouise Newer, PA-C [facility] 01/04/2019, 3:08 PM  Cc: Sharion Balloon, FNP

## 2019-01-04 NOTE — Progress Notes (Signed)
Reviewed and agree with management plan.  Parilee Hally T. Mischell Branford, MD FACG [facility]  

## 2019-01-04 NOTE — Patient Instructions (Signed)
If you are age 40 or older, your body mass index should be between 23-30. Your Body mass index is 28.17 kg/m. If this is out of the aforementioned range listed, please consider follow up with your Primary Care Provider.  If you are age 49 or younger, your body mass index should be between 19-25. Your Body mass index is 28.17 kg/m. If this is out of the aformentioned range listed, please consider follow up with your Primary Care Provider.   You have been scheduled for an endoscopy. Please follow written instructions given to you at your visit today. If you use inhalers (even only as needed), please bring them with you on the day of your procedure. Your physician has requested that you go to www.startemmi.com and enter the access code given to you at your visit today. This web site gives a general overview about your procedure. However, you should still follow specific instructions given to you by our office regarding your preparation for the procedure.  We have sent the following medications to your pharmacy for you to pick up at your convenience: Pantoprazole  STOP NSAIDS   ie Ibuprofen, Advil, Aleve etc.  Thank you for choosing me and [facility].    Ellouise Newer, PA-C

## 2019-02-01 ENCOUNTER — Encounter: Payer: Managed Care, Other (non HMO) | Admitting: Gastroenterology

## 2019-05-04 ENCOUNTER — Telehealth: Payer: Self-pay | Admitting: Physician Assistant

## 2019-05-04 MED ORDER — PANTOPRAZOLE SODIUM 40 MG PO TBEC: 40.0000 mg | DELAYED_RELEASE_TABLET | Freq: Every day | ORAL | 3 refills | Status: DC

## 2019-05-04 NOTE — Telephone Encounter (Signed)
Rx sent to pharmacy   

## 2019-05-04 NOTE — Telephone Encounter (Signed)
Patient requesting refill on Protonix 

## 2019-05-19 ENCOUNTER — Ambulatory Visit (INDEPENDENT_AMBULATORY_CARE_PROVIDER_SITE_OTHER): Payer: Managed Care, Other (non HMO) | Admitting: Family Medicine

## 2019-05-19 ENCOUNTER — Encounter: Payer: Self-pay | Admitting: Family Medicine

## 2019-05-19 ENCOUNTER — Other Ambulatory Visit: Payer: Self-pay

## 2019-05-19 VITALS — BP 136/100 | HR 86 | Temp 97.3°F | Resp 20 | Ht 71.0 in | Wt 207.4 lb

## 2019-05-19 DIAGNOSIS — F339 Major depressive disorder, recurrent, unspecified: Secondary | ICD-10-CM | POA: Diagnosis not present

## 2019-05-19 DIAGNOSIS — Z13220 Encounter for screening for lipoid disorders: Secondary | ICD-10-CM | POA: Diagnosis not present

## 2019-05-19 DIAGNOSIS — R5383 Other fatigue: Secondary | ICD-10-CM | POA: Diagnosis not present

## 2019-05-19 MED ORDER — ESCITALOPRAM OXALATE 10 MG PO TABS: 10.0000 mg | ORAL_TABLET | Freq: Every day | ORAL | 1 refills | Status: DC

## 2019-05-19 NOTE — Progress Notes (Signed)
Subjective:  Patient ID: Kevin Berg, male    DOB: 02/04/1980  Age: 39 y.o. MRN: [id number]  CC: No chief complaint on file.   HPI [facility] presents for feelings of depression.  He recently divorced.  He wants to spend time with his son.  He is having to work a lot of overtime.  He has conflict with supervisors at work ongoing.  Unfortunately he is self-medicating with alcohol drinking up to 12 beers at night on binges.  He is trying to cut back.  PHQ noted below.  Confirmed through discussion.  Depression screen [facility] 2/9 05/19/2019 11/22/2018 11/23/2017  Decreased Interest 3 0 0  Down, Depressed, Hopeless 1 0 1  PHQ - 2 Score 4 0 1  Altered sleeping 3 - 0  Tired, decreased energy 3 - 2  Change in appetite 1 - 0  Feeling bad or failure about yourself  1 - 0  Trouble concentrating 2 - 1  Moving slowly or fidgety/restless 3 - 0  Suicidal thoughts 0 - 0  PHQ-9 Score 17 - 4  Difficult doing work/chores Very difficult - Somewhat difficult    History Kevin Berg has a past medical history of Alcohol abuse, Anxiety and depression, Arthritis, GERD (gastroesophageal reflux disease), H/O ETOH abuse (09/19/2013), Hyperlipemia, Hypersomnia, 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   He has a past surgical history that includes Knee surgery (Left, 2014) and Meniscus repair (2014).   His family history includes Arthritis in his mother; Diabetes in his father; Heart disease in his maternal grandmother; Hypertension in his father and mother; Post-traumatic stress disorder in his brother.He reports that he has never smoked. His smokeless tobacco use includes chew. He reports current alcohol use of about 24.0 standard drinks of alcohol per week. He reports that he does not use drugs.    ROS Review of Systems  Constitutional: Negative for fever.  Respiratory: Negative for shortness of breath.   Cardiovascular: Negative for chest pain.  Musculoskeletal: Negative for arthralgias.  Skin:  Negative for rash.    Objective:  BP (!) 136/100   Pulse 86   Temp (!) 97.3 F (36.3 C) (Temporal)   Resp 20   Ht 5' 11"  (1.803 m)   Wt 207 lb 6 oz (94.1 kg)   SpO2 100%   BMI 28.92 kg/m   BP Readings from Last 3 Encounters:  05/19/19 (!) 136/100  01/04/19 124/84  11/22/18 118/71    Wt Readings from Last 3 Encounters:  05/19/19 207 lb 6 oz (94.1 kg)  01/04/19 202 lb (91.6 kg)  11/22/18 202 lb (91.6 kg)     Physical Exam Vitals reviewed.  Constitutional:      Appearance: He is well-developed.  HENT:     Head: Normocephalic and atraumatic.     Right Ear: External ear normal.     Left Ear: External ear normal.     Mouth/Throat:     Pharynx: No oropharyngeal exudate or posterior oropharyngeal erythema.  Eyes:     Pupils: Pupils are equal, round, and reactive to light.  Cardiovascular:     Rate and Rhythm: Normal rate and regular rhythm.     Heart sounds: No murmur.  Pulmonary:     Effort: No respiratory distress.     Breath sounds: Normal breath sounds.  Musculoskeletal:     Cervical back: Normal range of motion and neck supple.  Neurological:     Mental Status: He is alert and oriented to person, place, and time.  Assessment & Plan:   Diagnoses and all orders for this visit:  Depression, recurrent ([place]) -     CBC with Differential/Platelet -     CMP14+EGFR  Fatigue, unspecified type -     CBC with Differential/Platelet -     CMP14+EGFR -     TSH -     Testosterone,Free and Total  Screening for cholesterol level -     CBC with Differential/Platelet -     CMP14+EGFR -     Lipid panel  Other orders -     escitalopram (LEXAPRO) 10 MG tablet; Take 1 tablet (10 mg total) by mouth daily.       I have discontinued Kevin Berg "Kevin Berg"'s naproxen. I am also having him start on escitalopram. Additionally, I am having him maintain his Multiple Vitamin (MULTI VITAMIN MENS PO) and pantoprazole.  Allergies as of 05/19/2019   No Known  Allergies     Medication List       Accurate as of May 19, 2019 11:59 PM. If you have any questions, ask your nurse or doctor.        STOP taking these medications   naproxen 500 MG tablet Commonly known as: Naprosyn Stopped by: Claretta Fraise, MD     TAKE these medications   escitalopram 10 MG tablet Commonly known as: LEXAPRO Take 1 tablet (10 mg total) by mouth daily. Started by: Claretta Fraise, MD   MULTI VITAMIN MENS PO Take by mouth.   pantoprazole 40 MG tablet Commonly known as: PROTONIX Take 1 tablet (40 mg total) by mouth daily. Take 30-60 minutes before breakfast.        Follow-up: Return in about 2 weeks (around 06/02/2019).  Claretta Fraise, M.D.

## 2019-05-21 LAB — CMP14+EGFR
ALT: 27 IU/L (ref 0–44)
AST: 22 IU/L (ref 0–40)
Albumin/Globulin Ratio: 1.7 (ref 1.2–2.2)
Albumin: 4.7 g/dL (ref 4.0–5.0)
Alkaline Phosphatase: 78 IU/L (ref 39–117)
BUN/Creatinine Ratio: 15 (ref 9–20)
BUN: 14 mg/dL (ref 6–24)
Bilirubin Total: 0.5 mg/dL (ref 0.0–1.2)
CO2: 21 mmol/L (ref 20–29)
Calcium: 9.6 mg/dL (ref 8.7–10.2)
Chloride: 106 mmol/L (ref 96–106)
Creatinine, Ser: 0.95 mg/dL (ref 0.76–1.27)
GFR calc Af Amer: 115 mL/min/{1.73_m2} (ref >59)
GFR calc non Af Amer: 100 mL/min/{1.73_m2} (ref >59)
Globulin, Total: 2.7 g/dL (ref 1.5–4.5)
Glucose: 102 mg/dL — ABNORMAL HIGH (ref 65–99)
Potassium: 4.8 mmol/L (ref 3.5–5.2)
Sodium: 142 mmol/L (ref 134–144)
Total Protein: 7.4 g/dL (ref 6.0–8.5)

## 2019-05-21 LAB — LIPID PANEL
Chol/HDL Ratio: 5.3 ratio — ABNORMAL HIGH (ref 0.0–5.0)
Cholesterol, Total: 217 mg/dL — ABNORMAL HIGH (ref 100–199)
HDL: 41 mg/dL (ref >39)
LDL Chol Calc (NIH): 159 mg/dL — ABNORMAL HIGH (ref 0–99)
Triglycerides: 92 mg/dL (ref 0–149)
VLDL Cholesterol Cal: 17 mg/dL (ref 5–40)

## 2019-05-21 LAB — CBC WITH DIFFERENTIAL/PLATELET
Basophils Absolute: 0.1 10*3/uL (ref 0.0–0.2)
Basos: 1 %
EOS (ABSOLUTE): 0.2 10*3/uL (ref 0.0–0.4)
Eos: 2 %
Hematocrit: 49 % (ref 37.5–51.0)
Hemoglobin: 16.4 g/dL (ref 13.0–17.7)
Immature Grans (Abs): 0.1 10*3/uL (ref 0.0–0.1)
Immature Granulocytes: 1 %
Lymphocytes Absolute: 1.7 10*3/uL (ref 0.7–3.1)
Lymphs: 23 %
MCH: 30.8 pg (ref 26.6–33.0)
MCHC: 33.5 g/dL (ref 31.5–35.7)
MCV: 92 fL (ref 79–97)
Monocytes Absolute: 0.8 10*3/uL (ref 0.1–0.9)
Monocytes: 11 %
Neutrophils Absolute: 4.7 10*3/uL (ref 1.4–7.0)
Neutrophils: 62 %
Platelets: 209 10*3/uL (ref 150–450)
RBC: 5.33 x10E6/uL (ref 4.14–5.80)
RDW: 12.2 % (ref 11.6–15.4)
WBC: 7.4 10*3/uL (ref 3.4–10.8)

## 2019-05-21 LAB — TSH: TSH: 1.16 u[IU]/mL (ref 0.450–4.500)

## 2019-05-21 LAB — TESTOSTERONE,FREE AND TOTAL
Testosterone, Free: 14.6 pg/mL (ref 6.8–21.5)
Testosterone: 426 ng/dL (ref 264–916)

## 2019-05-22 ENCOUNTER — Encounter: Payer: Self-pay | Admitting: Family Medicine

## 2019-06-08 ENCOUNTER — Ambulatory Visit: Payer: Managed Care, Other (non HMO) | Admitting: Family Medicine

## 2019-08-18 ENCOUNTER — Other Ambulatory Visit: Payer: Self-pay | Admitting: *Deleted

## 2019-08-18 MED ORDER — PANTOPRAZOLE SODIUM 40 MG PO TBEC: 40.0000 mg | DELAYED_RELEASE_TABLET | Freq: Every day | ORAL | 3 refills | Status: DC

## 2020-06-28 ENCOUNTER — Encounter: Payer: Self-pay | Admitting: Nurse Practitioner

## 2020-06-28 ENCOUNTER — Other Ambulatory Visit: Payer: Self-pay

## 2020-06-28 ENCOUNTER — Ambulatory Visit: Payer: Managed Care, Other (non HMO) | Admitting: Nurse Practitioner

## 2020-06-28 VITALS — BP 142/92 | HR 86 | Temp 98.7°F | Resp 20 | Ht 71.0 in | Wt 232.0 lb

## 2020-06-28 DIAGNOSIS — M25562 Pain in left knee: Secondary | ICD-10-CM | POA: Diagnosis not present

## 2020-06-28 DIAGNOSIS — Z87898 Personal history of other specified conditions: Secondary | ICD-10-CM | POA: Diagnosis not present

## 2020-06-28 MED ORDER — SCOPOLAMINE 1 MG/3DAYS TD PT72: 1.0000 | MEDICATED_PATCH | TRANSDERMAL | 0 refills | Status: DC

## 2020-06-28 MED ORDER — PREDNISONE 20 MG PO TABS: 40.0000 mg | ORAL_TABLET | Freq: Every day | ORAL | 0 refills | Status: AC

## 2020-06-28 NOTE — Progress Notes (Signed)
   Subjective:    Patient ID: Kevin Berg, male    DOB: 09/26/1980, 40 y.o.   MRN: [id number]   Chief Complaint: Need motion sickness patches and Knee Pain (Wants a shot)   HPI Patient comes in today to discuss 2 issues: - he is getting ready to go deep sea fishing and needs patches for motion sickness. - left knee pain. He has history of torn meniscus. He said that he had it "pop out of place" last week, swelled up for a dya and is now sore. Is able to perform everyday activities.    Review of Systems  Constitutional: Negative.   Respiratory: Negative.    Cardiovascular: Negative.   Musculoskeletal:  Positive for arthralgias (left knee pain).  All other systems reviewed and are negative.     Objective:   Physical Exam Vitals reviewed.  Constitutional:      Appearance: Normal appearance.  Cardiovascular:     Rate and Rhythm: Normal rate and regular rhythm.     Heart sounds: Normal heart sounds.  Pulmonary:     Effort: Pulmonary effort is normal.     Breath sounds: Normal breath sounds.  Musculoskeletal:     Comments: FROM of left knee without pain No patella tenderness No effusion   Skin:    General: Skin is warm.  Neurological:     General: No focal deficit present.     Mental Status: He is alert and oriented to person, place, and time.  Psychiatric:        Mood and Affect: Mood normal.        Behavior: Behavior normal.    BP (!) 142/92   Pulse 86   Temp 98.7 F (37.1 C) (Temporal)   Resp 20   Ht 5\' 11"  (1.803 m)   Wt 232 lb (105.2 kg)   SpO2 94%   BMI 32.36 kg/m        Assessment & Plan:  F[place] in today with chief complaint of Need motion sickness patches and Knee Pain (Wants a shot)   1. Hx of motion sickness - scopolamine (TRANSDERM-SCOP, 1.5 MG,) 1 MG/3DAYS; Place 1 patch (1.5 mg total) onto the skin every 3 (three) days.  Dispense: 4 patch; Refill: 0  2. Acute pain of left knee Ice bid Rest  Rto prn - predniSONE (DELTASONE)  20 MG tablet; Take 2 tablets (40 mg total) by mouth daily with breakfast for 5 days. 2 po daily for 5 days  Dispense: 10 tablet; Refill: 0    The above assessment and management plan was discussed with the patient. The patient verbalized understanding of and has agreed to the management plan. Patient is aware to call the clinic if symptoms persist or worsen. Patient is aware when to return to the clinic for a follow-up visit. Patient educated on when it is appropriate to go to the emergency department.   Mary-Margaret Kathi Der, FNP

## 2020-06-28 NOTE — Patient Instructions (Signed)
Motion Sickness Motion sickness can happen when you travel in a boat, car, or airplane, or when you go on an amusement park ride. You may: Feel dizzy. Feel sick to your stomach (nauseous). Throw up (vomit). Be sweaty. Have belly (abdominal) pain. Be pale. These problems usually go away when the motion stops. For some people, problems may last for hours or days. There are things that you can do to help prevent motion sickness. Follow these instructions at home: Medicines Take or use over-the-counter and prescription medicines only as told by your doctor. If you use a motion sickness patch, wash your hands right after you put the patch on. Eating and drinking Drink enough fluid to keep your pee (urine) pale yellow. While having motion sickness, take small sips of liquids often. Keeping enough fluid in your body (staying hydrated) may help relieve or prevent problems. Do not eat large meals before or during travel. If you are traveling far, eat small, plain meals. Do not drink alcohol before or during travel.   When riding in a moving vehicle: Sit where the least amount of motion is happening. On an airplane, sit near the wing. Lie back in your seat if you can. On a boat, sit near the middle. In a car, sit in the front seat. Avoid the back seat. Breathe slowly and deeply. Do not read or focus on nearby things such as your phone. Try watching the horizon or a distant object. This is especially helpful when you are on a boat. In a car, ride in the front seat and look out the front window. Get some fresh air if you can. Open a window when you are riding in a car. General instructions If possible, avoid activities that cause motion sickness. Do not smoke before or during travel. Avoid areas where people are smoking. Plan ahead for travel. Ask your doctor if you should take medicines to help prevent motion sickness. Contact a doctor if: You still throw up or feel sick to your stomach after 24  hours. You see blood in your vomit. The blood might be dark red, or it may look like coffee grounds. You pass out (faint). You feel very dizzy or light-headed when you stand up. You have a fever. Get help right away if: You have very bad belly pain. You have very bad chest pain. You have trouble breathing. You have a very bad headache. You lose feeling (have numbness) on one side of your body. You feel weak on one side of your body. You have trouble speaking. Summary Motion sickness can happen when you travel in a boat, car, or airplane, or when you go on an amusement park ride. Problems usually go away when the motion stops. Plan ahead for travel. Ask your doctor if you should take medicines to help prevent motion sickness. This information is not intended to replace advice given to you by your health care provider. Make sure you discuss any questions you have with your health care provider. Document Revised: 12/19/2016 Document Reviewed: 10/16/2016 Elsevier Patient Education  2021 [employer].

## 2020-08-07 ENCOUNTER — Other Ambulatory Visit: Payer: Self-pay

## 2020-08-07 ENCOUNTER — Encounter: Payer: Self-pay | Admitting: Family Medicine

## 2020-08-07 ENCOUNTER — Ambulatory Visit (INDEPENDENT_AMBULATORY_CARE_PROVIDER_SITE_OTHER): Payer: Self-pay | Admitting: Family Medicine

## 2020-08-07 VITALS — BP 137/92 | HR 95 | Temp 97.5°F | Ht 71.0 in | Wt 233.6 lb

## 2020-08-07 DIAGNOSIS — Z024 Encounter for examination for driving license: Secondary | ICD-10-CM

## 2020-08-07 DIAGNOSIS — Z0000 Encounter for general adult medical examination without abnormal findings: Secondary | ICD-10-CM

## 2020-08-07 LAB — URINALYSIS
Bilirubin, UA: NEGATIVE
Glucose, UA: NEGATIVE
Ketones, UA: NEGATIVE
Leukocytes,UA: NEGATIVE
Nitrite, UA: NEGATIVE
Protein,UA: NEGATIVE
RBC, UA: NEGATIVE
Specific Gravity, UA: >1.03 — ABNORMAL HIGH (ref 1.005–1.030)
Urobilinogen, Ur: 0.2 mg/dL (ref 0.2–1.0)
pH, UA: 5.5 (ref 5.0–7.5)

## 2020-08-07 NOTE — Progress Notes (Signed)
DOT  evaluatio performed. Qualified for 2 years. See attached form  Mechele Claude, MD

## 2021-06-04 ENCOUNTER — Encounter: Payer: 59 | Admitting: Family Medicine

## 2021-06-26 ENCOUNTER — Encounter: Payer: 59 | Admitting: Family Medicine

## 2021-10-14 ENCOUNTER — Encounter: Payer: Self-pay | Admitting: Family Medicine

## 2021-10-14 ENCOUNTER — Ambulatory Visit: Payer: 59 | Admitting: Family Medicine

## 2021-10-14 VITALS — BP 146/95 | HR 79 | Temp 97.6°F | Ht 71.0 in | Wt 241.6 lb

## 2021-10-14 DIAGNOSIS — T753XXA Motion sickness, initial encounter: Secondary | ICD-10-CM | POA: Diagnosis not present

## 2021-10-14 MED ORDER — SCOPOLAMINE 1 MG/3DAYS TD PT72: 1.0000 | MEDICATED_PATCH | TRANSDERMAL | 2 refills | Status: DC

## 2021-10-14 NOTE — Progress Notes (Signed)
Chief Complaint  Patient presents with   Medical Management of Chronic Issues    HPI  Patient presents today for Flying for the first time. Going on Oct. 8. Going to [place], [place]. Used scopolamine patch in the past for boat trips.   PMH: Smoking status noted ROS: Per HPI  Objective: BP (!) 146/95   Pulse 79   Temp 97.6 F (36.4 C)   Ht 5\' 11"  (1.803 m)   Wt 241 lb 9.6 oz (109.6 kg)   SpO2 96%   BMI 33.70 kg/m  Gen: NAD, alert, cooperative with exam HEENT: NCAT, EOMI, PERRL CV: RRR, good S1/S2, no murmur Resp: CTABL, no wheezes, non-labored Ext: No edema, warm Neuro: Alert and oriented, No gross deficits  Assessment and plan:  1. Air sickness, initial encounter     Meds ordered this encounter  Medications   scopolamine (TRANSDERM-SCOP) 1 MG/3DAYS    Sig: Place 1 patch (1.5 mg total) onto the skin every 3 (three) days.    Dispense:  2 patch    Refill:  2      Follow up as needed.  Claretta Fraise, MD

## 2022-01-30 ENCOUNTER — Ambulatory Visit (INDEPENDENT_AMBULATORY_CARE_PROVIDER_SITE_OTHER): Payer: 59 | Admitting: Family Medicine

## 2022-01-30 ENCOUNTER — Encounter: Payer: Self-pay | Admitting: Family Medicine

## 2022-01-30 VITALS — BP 165/108 | HR 85 | Temp 96.1°F | Ht 71.0 in | Wt 242.8 lb

## 2022-01-30 DIAGNOSIS — R062 Wheezing: Secondary | ICD-10-CM | POA: Diagnosis not present

## 2022-01-30 DIAGNOSIS — R030 Elevated blood-pressure reading, without diagnosis of hypertension: Secondary | ICD-10-CM | POA: Diagnosis not present

## 2022-01-30 DIAGNOSIS — J069 Acute upper respiratory infection, unspecified: Secondary | ICD-10-CM | POA: Diagnosis not present

## 2022-01-30 MED ORDER — ALBUTEROL SULFATE HFA 108 (90 BASE) MCG/ACT IN AERS: 2.0000 | INHALATION_SPRAY | Freq: Four times a day (QID) | RESPIRATORY_TRACT | 2 refills | Status: DC | PRN

## 2022-01-30 MED ORDER — AMOXICILLIN-POT CLAVULANATE 875-125 MG PO TABS: 1.0000 | ORAL_TABLET | Freq: Two times a day (BID) | ORAL | 0 refills | Status: AC

## 2022-01-30 MED ORDER — BENZONATATE 200 MG PO CAPS: 200.0000 mg | ORAL_CAPSULE | Freq: Two times a day (BID) | ORAL | 0 refills | Status: DC | PRN

## 2022-01-30 NOTE — Patient Instructions (Signed)

## 2022-01-30 NOTE — Progress Notes (Signed)
Subjective:  Patient ID: Kevin Berg, male    DOB: 11/24/1980, 42 y.o.   MRN: [id number]  Patient Care Team: Mechele Claude, MD as PCP - General (Family Medicine) Remus Loffler, PA-C (General Practice)   Chief Complaint:  Cough, Nasal Congestion (/), and Shortness of Breath (X 1 1/2 weeks. Had fever 12/31-1/2)   HPI: Kevin Berg is a 42 y.o. male presenting on 01/30/2022 for Cough, Nasal Congestion (/), and Shortness of Breath (X 1 1/2 weeks. Had fever 12/31-1/2)   Cough This is a new problem. Episode onset: 1.5-2 weeks ago. The problem has been gradually worsening. The problem occurs every few minutes. The cough is Productive of purulent sputum. Associated symptoms include chills, a fever, headaches, nasal congestion, shortness of breath and wheezing. Pertinent negatives include no chest pain, ear congestion, ear pain, heartburn, hemoptysis, myalgias, postnasal drip, rash, rhinorrhea, sore throat, sweats or weight loss. He has tried OTC cough suppressant for the symptoms. The treatment provided mild relief.  Shortness of Breath This is a new problem. The problem occurs intermittently. The problem has been waxing and waning. Associated symptoms include a fever, headaches, sputum production and wheezing. Pertinent negatives include no abdominal pain, chest pain, claudication, coryza, ear pain, hemoptysis, leg pain, leg swelling, neck pain, orthopnea, PND, rash, rhinorrhea, sore throat, swollen glands, syncope or vomiting. Exacerbated by: exertion. The patient has no known risk factors for DVT/PE. He has tried OTC cough suppressants for the symptoms. The treatment provided mild relief.    Relevant past medical, surgical, family, and social history reviewed and updated as indicated.  Allergies and medications reviewed and updated. Data reviewed: Chart in Epic.   Past Medical History:  Diagnosis Date   Alcohol abuse    Anxiety and depression    Arthritis    GERD  (gastroesophageal reflux disease)    H/O ETOH abuse 09/19/2013   Hyperlipemia    Hypersomnia, persistent 09/19/2013   Snorings 09/19/2013    Past Surgical History:  Procedure Laterality Date   KNEE SURGERY Left 2014   MENISCUS REPAIR  2014    Social History   Socioeconomic History   Marital status: Single    Spouse name: Dois Davenport   Number of children: 2   Years of education: 12   Highest education level: Not on file  Occupational History   Occupation: [employer] Education officer, community: [employer]  Tobacco Use   Smoking status: Never   Smokeless tobacco: Current    Types: Chew  Vaping Use   Vaping Use: Never used  Substance and Sexual Activity   Alcohol use: Yes    Alcohol/week: 24.0 standard drinks of alcohol    Types: 24 Cans of beer per week   Drug use: No   Sexual activity: Not on file  Other Topics Concern   Not on file  Social History Narrative   ** Merged History Encounter **       Patient is married Dois Davenport) and lives at home with his wife and two children. Patient is working at [employer] Teacher, early years/pre). Patient has a high school education. Patient is right-handed. Patient drinks two cups of coffee every morning, one 20 0z of    sodas daily and tea not quite as often.   Social Determinants of Health   Financial Resource Strain: Not on file  Food Insecurity: Not on file  Transportation Needs: Not on file  Physical Activity: Not on file  Stress: Not on file  Social Connections: Not on file  Intimate Partner Violence: Not on file    Outpatient Encounter Medications as of 01/30/2022  Medication Sig   albuterol (VENTOLIN HFA) 108 (90 Base) MCG/ACT inhaler Inhale 2 puffs into the lungs every 6 (six) hours as needed for wheezing or shortness of breath.   amoxicillin-clavulanate (AUGMENTIN) 875-125 MG tablet Take 1 tablet by mouth 2 (two) times daily for 10 days.   benzonatate (TESSALON) 200 MG capsule Take 1 capsule (200 mg total) by mouth 2 (two) times daily as needed  for cough.   Multiple Vitamin (MULTI VITAMIN MENS PO) Take by mouth.     scopolamine (TRANSDERM-SCOP) 1 MG/3DAYS Place 1 patch (1.5 mg total) onto the skin every 3 (three) days. (Patient not taking: Reported on 01/30/2022)   No facility-administered encounter medications on file as of 01/30/2022.    No Known Allergies  Review of Systems  Constitutional:  Positive for activity change, appetite change, chills, fatigue and fever. Negative for diaphoresis, unexpected weight change and weight loss.  HENT:  Positive for congestion. Negative for ear pain, postnasal drip, rhinorrhea and sore throat.   Eyes: Negative.   Respiratory:  Positive for cough, sputum production, shortness of breath and wheezing. Negative for apnea, hemoptysis, choking, chest tightness and stridor.   Cardiovascular:  Negative for chest pain, palpitations, orthopnea, claudication, leg swelling, syncope and PND.  Gastrointestinal:  Negative for abdominal pain, blood in stool, constipation, diarrhea, heartburn, nausea and vomiting.  Endocrine: Negative.   Genitourinary:  Negative for decreased urine volume, difficulty urinating, dysuria, frequency and urgency.  Musculoskeletal:  Negative for arthralgias, myalgias and neck pain.  Skin: Negative.  Negative for rash.  Allergic/Immunologic: Negative.   Neurological:  Positive for headaches. Negative for dizziness, tremors, seizures, syncope, facial asymmetry, speech difficulty, weakness, light-headedness and numbness.  Hematological: Negative.   Psychiatric/Behavioral:  Negative for confusion, hallucinations, sleep disturbance and suicidal ideas.   All other systems reviewed and are negative.       Objective:  BP (!) 165/108   Pulse 85   Temp (!) 96.1 F (35.6 C) (Temporal)   Ht 5\' 11"  (1.803 m)   Wt 242 lb 12.8 oz (110.1 kg)   SpO2 96%   BMI 33.86 kg/m    Wt Readings from Last 3 Encounters:  01/30/22 242 lb 12.8 oz (110.1 kg)  10/14/21 241 lb 9.6 oz (109.6 kg)   08/07/20 233 lb 9.6 oz (106 kg)    Physical Exam Vitals and nursing note reviewed.  Constitutional:      General: He is not in acute distress.    Appearance: Normal appearance. He is well-developed and well-groomed. He is obese. He is not ill-appearing, toxic-appearing or diaphoretic.  HENT:     Head: Normocephalic and atraumatic.     Jaw: There is normal jaw occlusion.     Right Ear: Hearing normal.     Left Ear: Hearing normal.     Nose: Nose normal.     Mouth/Throat:     Lips: Pink.     Mouth: Mucous membranes are moist.     Pharynx: Oropharynx is clear. Uvula midline.  Eyes:     General: Lids are normal.     Extraocular Movements: Extraocular movements intact.     Conjunctiva/sclera: Conjunctivae normal.     Pupils: Pupils are equal, round, and reactive to light.  Neck:     Thyroid: No thyroid mass, thyromegaly or thyroid tenderness.     Vascular: No carotid bruit or JVD.  Trachea: Trachea and phonation normal.  Cardiovascular:     Rate and Rhythm: Normal rate and regular rhythm.     Chest Wall: PMI is not displaced.     Pulses: Normal pulses.     Heart sounds: Normal heart sounds. No murmur heard.    No friction rub. No gallop.  Pulmonary:     Effort: Pulmonary effort is normal. No respiratory distress.     Breath sounds: Wheezing (mild, scattered, expiratory) present.  Abdominal:     General: Bowel sounds are normal. There is no distension or abdominal bruit.     Palpations: Abdomen is soft. There is no hepatomegaly or splenomegaly.     Tenderness: There is no abdominal tenderness. There is no right CVA tenderness or left CVA tenderness.     Hernia: No hernia is present.  Musculoskeletal:     Cervical back: Normal range of motion and neck supple.     Right lower leg: No edema.     Left lower leg: No edema.  Lymphadenopathy:     Cervical: No cervical adenopathy.  Skin:    General: Skin is warm and dry.     Capillary Refill: Capillary refill takes less than 2  seconds.     Coloration: Skin is not cyanotic, jaundiced or pale.     Findings: No rash.  Neurological:     General: No focal deficit present.     Mental Status: He is alert and oriented to person, place, and time.     Sensory: Sensation is intact.     Motor: Motor function is intact.     Coordination: Coordination is intact.     Gait: Gait is intact.     Deep Tendon Reflexes: Reflexes are normal and symmetric.  Psychiatric:        Attention and Perception: Attention and perception normal.        Mood and Affect: Mood and affect normal.        Speech: Speech normal.        Behavior: Behavior normal. Behavior is cooperative.        Thought Content: Thought content normal.        Cognition and Memory: Cognition and memory normal.        Judgment: Judgment normal.     Results for orders placed or performed in visit on 08/07/20  Urinalysis  Result Value Ref Range   Specific Gravity, UA >1.030 (H) 1.005 - 1.030   pH, UA 5.5 5.0 - 7.5   Color, UA Yellow Yellow   Appearance Ur Clear Clear   Leukocytes,UA Negative Negative   Protein,UA Negative Negative/Trace   Glucose, UA Negative Negative   Ketones, UA Negative Negative   RBC, UA Negative Negative   Bilirubin, UA Negative Negative   Urobilinogen, Ur 0.2 0.2 - 1.0 mg/dL   Nitrite, UA Negative Negative       Pertinent labs & imaging results that were available during my care of the patient were reviewed by me and considered in my medical decision making.  Assessment & Plan:  Kevin Berg was seen today for cough, nasal congestion and shortness of breath.  Diagnoses and all orders for this visit:  URI with cough and congestion Ongoing symptoms for over 1.5 weeks, will initiate below. Aware to increase water intake. Tylenol as needed for fever/pain control. Report new, worsening, or persistent symptoms.  -     amoxicillin-clavulanate (AUGMENTIN) 875-125 MG tablet; Take 1 tablet by mouth 2 (two) times daily for 10  days. -      albuterol (VENTOLIN HFA) 108 (90 Base) MCG/ACT inhaler; Inhale 2 puffs into the lungs every 6 (six) hours as needed for wheezing or shortness of breath. -     benzonatate (TESSALON) 200 MG capsule; Take 1 capsule (200 mg total) by mouth 2 (two) times daily as needed for cough.  Wheezing Declined prednisone. Albuterol use discussed in detail. Medications as prescribed. Report new, worsening, or persistent symptoms.  -     amoxicillin-clavulanate (AUGMENTIN) 875-125 MG tablet; Take 1 tablet by mouth 2 (two) times daily for 10 days. -     albuterol (VENTOLIN HFA) 108 (90 Base) MCG/ACT inhaler; Inhale 2 puffs into the lungs every 6 (six) hours as needed for wheezing or shortness of breath.  Elevated blood pressure reading in office without diagnosis of hypertension DASH diet and exercise discussed in detail. BP log provided to pt. Aware to record readings over the next 2 weeks. Return for reevaluation as pt may need to be started on medications.     Continue all other maintenance medications.  Follow up plan: Return in about 2 weeks (around 02/13/2022), or if symptoms worsen or fail to improve, for BP .   Continue healthy lifestyle choices, including diet (rich in fruits, vegetables, and lean proteins, and low in salt and simple carbohydrates) and exercise (at least 30 minutes of moderate physical activity daily).  Educational handout given for DASH diet, URI  The above assessment and management plan was discussed with the patient. The patient verbalized understanding of and has agreed to the management plan. Patient is aware to call the clinic if they develop any new symptoms or if symptoms persist or worsen. Patient is aware when to return to the clinic for a follow-up visit. Patient educated on when it is appropriate to go to the emergency department.   Monia Pouch, FNP-C [facility] [phone]

## 2022-02-13 ENCOUNTER — Ambulatory Visit (INDEPENDENT_AMBULATORY_CARE_PROVIDER_SITE_OTHER): Payer: 59 | Admitting: Family Medicine

## 2022-02-13 ENCOUNTER — Other Ambulatory Visit: Payer: Self-pay | Admitting: *Deleted

## 2022-02-13 ENCOUNTER — Encounter: Payer: Self-pay | Admitting: Family Medicine

## 2022-02-13 VITALS — BP 162/96 | HR 84 | Temp 97.5°F | Ht 71.0 in | Wt 246.0 lb

## 2022-02-13 DIAGNOSIS — I10 Essential (primary) hypertension: Secondary | ICD-10-CM | POA: Diagnosis not present

## 2022-02-13 DIAGNOSIS — R5383 Other fatigue: Secondary | ICD-10-CM

## 2022-02-13 DIAGNOSIS — R030 Elevated blood-pressure reading, without diagnosis of hypertension: Secondary | ICD-10-CM | POA: Diagnosis not present

## 2022-02-13 DIAGNOSIS — E785 Hyperlipidemia, unspecified: Secondary | ICD-10-CM | POA: Diagnosis not present

## 2022-02-13 MED ORDER — LISINOPRIL 10 MG PO TABS: 10.0000 mg | ORAL_TABLET | Freq: Every day | ORAL | 0 refills | Status: DC

## 2022-02-13 NOTE — Patient Instructions (Signed)

## 2022-02-13 NOTE — Progress Notes (Signed)
Subjective:  Patient ID: Kevin Berg, male    DOB: 1980-11-16, 42 y.o.   MRN: [id number]  Patient Care Team: Claretta Fraise, MD as PCP - General (Family Medicine) Terald Sleeper, PA-C (General Practice)   Chief Complaint:  Blood Pressure Check (2 week follow up )   HPI: Kevin Berg is a 42 y.o. male presenting on 02/13/2022 for Blood Pressure Check (2 week follow up )   Pt presents today for elevated blood pressure follow up. He was seen for an acute visit and his blood pressure was elevated during this visit. He was provided information on dietary and lifestyle changes to help lower readings and is following up today for recheck. He states he has not made any lifestyle changes. He has been monitoring his blood pressures at home and they are all elevated readings. He does report headache and fatigue. Denies other associated symptoms.     Relevant past medical, surgical, family, and social history reviewed and updated as indicated.  Allergies and medications reviewed and updated. Data reviewed: Chart in Epic.   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 Patient is working at [employer] Engineer, materials). Patient has a high school education. Patient is right-handed. Patient drinks two cups of coffee every morning, one 20 0z of    sodas daily and tea not quite as often.   Social Determinants of Health   Financial Resource Strain: Not on file  Food Insecurity: Not on file  Transportation Needs: Not on file  Physical Activity: Not on file  Stress: Not on file  Social Connections: Not on file  Intimate Partner Violence: Not on file    Outpatient Encounter Medications as of 02/13/2022  Medication Sig   lisinopril (ZESTRIL) 10 MG tablet Take 1 tablet (10 mg total) by mouth daily.   Multiple Vitamin (MULTI VITAMIN MENS PO) Take by mouth.     albuterol (VENTOLIN HFA) 108 (90 Base) MCG/ACT inhaler Inhale 2 puffs into the lungs every 6 (six) hours as needed for wheezing or shortness of breath. (Patient not taking: Reported on 02/13/2022)   scopolamine (TRANSDERM-SCOP) 1 MG/3DAYS Place 1 patch (1.5 mg total) onto the  skin every 3 (three) days. (Patient not taking: Reported on 01/30/2022)   [DISCONTINUED] benzonatate (TESSALON) 200 MG capsule Take 1 capsule (200 mg total) by mouth 2 (two) times daily as needed for cough.   No facility-administered encounter medications on file as of 02/13/2022.    No Known Allergies  Review of Systems  Constitutional:  Positive for activity change and fatigue. Negative for appetite change, chills, diaphoresis, fever and unexpected weight change.  HENT: Negative.    Eyes: Negative.  Negative for photophobia and visual disturbance.  Respiratory:  Negative for cough, chest tightness and shortness of breath.   Cardiovascular:  Negative for chest pain, palpitations and leg swelling.   Gastrointestinal:  Negative for abdominal pain, blood in stool, constipation, diarrhea, nausea and vomiting.  Endocrine: Negative.   Genitourinary:  Negative for decreased urine volume, difficulty urinating, dysuria, frequency and urgency.  Musculoskeletal:  Negative for arthralgias and myalgias.  Skin: Negative.   Allergic/Immunologic: Negative.   Neurological:  Positive for headaches. Negative for dizziness, tremors, seizures, syncope, facial asymmetry, speech difficulty, weakness, light-headedness and numbness.  Hematological: Negative.   Psychiatric/Behavioral:  Negative for confusion, hallucinations, sleep disturbance and suicidal ideas.   All other systems reviewed and are negative.       Objective:  BP (!) 162/96   Pulse 84   Temp (!) 97.5 F (36.4 C) (Temporal)   Ht 5\' 11"  (1.803 m)   Wt 246 lb (111.6 kg)   SpO2 96%   BMI 34.31 kg/m    Wt Readings from Last 3 Encounters:  02/13/22 246 lb (111.6 kg)  01/30/22 242 lb 12.8 oz (110.1 kg)  10/14/21 241 lb 9.6 oz (109.6 kg)    Physical Exam Vitals and nursing note reviewed.  Constitutional:      General: He is not in acute distress.    Appearance: Normal appearance. He is well-developed and well-groomed. He is obese. He is not ill-appearing, toxic-appearing or diaphoretic.  HENT:     Head: Normocephalic and atraumatic.     Jaw: There is normal jaw occlusion.     Right Ear: Hearing normal.     Left Ear: Hearing normal.     Nose: Nose normal.     Mouth/Throat:     Lips: Pink.     Mouth: Mucous membranes are moist.     Pharynx: Oropharynx is clear. Uvula midline.  Eyes:     General: Lids are normal.     Extraocular Movements: Extraocular movements intact.     Conjunctiva/sclera: Conjunctivae normal.     Pupils: Pupils are equal, round, and reactive to light.  Neck:     Thyroid: No thyroid mass, thyromegaly or thyroid tenderness.     Vascular: No carotid bruit or JVD.     Trachea: Trachea and phonation normal.   Cardiovascular:     Rate and Rhythm: Normal rate and regular rhythm.     Chest Wall: PMI is not displaced.     Pulses: Normal pulses.     Heart sounds: Normal heart sounds. No murmur heard.    No friction rub. No gallop.  Pulmonary:     Effort: Pulmonary effort is normal.     Breath sounds: Normal breath sounds.  Abdominal:     General: Bowel sounds are normal. There is no abdominal bruit.     Palpations: Abdomen is soft. There is no hepatomegaly or splenomegaly.  Musculoskeletal:        General: Normal range of motion.     Cervical back:  Normal range of motion and neck supple.     Right lower leg: No edema.     Left lower leg: No edema.  Lymphadenopathy:     Cervical: No cervical adenopathy.  Skin:    General: Skin is warm and dry.     Capillary Refill: Capillary refill takes less than 2 seconds.     Coloration: Skin is not cyanotic, jaundiced or pale.     Findings: No rash.  Neurological:     General: No focal deficit present.     Mental Status: He is alert and oriented to person, place, and time.     Sensory: Sensation is intact.     Motor: Motor function is intact.     Coordination: Coordination is intact.     Gait: Gait is intact.     Deep Tendon Reflexes: Reflexes are normal and symmetric.  Psychiatric:        Attention and Perception: Attention and perception normal.        Mood and Affect: Mood and affect normal.        Speech: Speech normal.        Behavior: Behavior normal. Behavior is cooperative.        Thought Content: Thought content normal.        Cognition and Memory: Cognition and memory normal.        Judgment: Judgment normal.     Results for orders placed or performed in visit on 08/07/20  Urinalysis  Result Value Ref Range   Specific Gravity, UA >1.030 (H) 1.005 - 1.030   pH, UA 5.5 5.0 - 7.5   Color, UA Yellow Yellow   Appearance Ur Clear Clear   Leukocytes,UA Negative Negative   Protein,UA Negative Negative/Trace   Glucose, UA Negative  Negative   Ketones, UA Negative Negative   RBC, UA Negative Negative   Bilirubin, UA Negative Negative   Urobilinogen, Ur 0.2 0.2 - 1.0 mg/dL   Nitrite, UA Negative Negative       Pertinent labs & imaging results that were available during my care of the patient were reviewed by me and considered in my medical decision making.  Assessment & Plan:  Odis was seen today for blood pressure check.  Diagnoses and all orders for this visit:  Primary hypertension Will start lisinopril today. Discussed the importance of lifestyle changes to help lower blood pressure readings. Pt to follow up in 2 weeks for reevaluation of BP and repeat BMP.  -     lisinopril (ZESTRIL) 10 MG tablet; Take 1 tablet (10 mg total) by mouth daily.     Continue all other maintenance medications.  Follow up plan: Return in about 2 years (around 02/14/2024) for with PCP for HTN, BMP.   Continue healthy lifestyle choices, including diet (rich in fruits, vegetables, and lean proteins, and low in salt and simple carbohydrates) and exercise (at least 30 minutes of moderate physical activity daily).  Educational handout given for DASH diet, HTN  The above assessment and management plan was discussed with the patient. The patient verbalized understanding of and has agreed to the management plan. Patient is aware to call the clinic if they develop any new symptoms or if symptoms persist or worsen. Patient is aware when to return to the clinic for a follow-up visit. Patient educated on when it is appropriate to go to the emergency department.   Kari Baars, FNP-C [facility] [phone]

## 2022-02-14 LAB — CMP14+EGFR
ALT: 115 IU/L — ABNORMAL HIGH (ref 0–44)
AST: 60 IU/L — ABNORMAL HIGH (ref 0–40)
Albumin/Globulin Ratio: 1.6 (ref 1.2–2.2)
Albumin: 4.4 g/dL (ref 4.1–5.1)
Alkaline Phosphatase: 96 IU/L (ref 44–121)
BUN/Creatinine Ratio: 11 (ref 9–20)
BUN: 9 mg/dL (ref 6–24)
Bilirubin Total: 0.6 mg/dL (ref 0.0–1.2)
CO2: 22 mmol/L (ref 20–29)
Calcium: 9.6 mg/dL (ref 8.7–10.2)
Chloride: 103 mmol/L (ref 96–106)
Creatinine, Ser: 0.83 mg/dL (ref 0.76–1.27)
Globulin, Total: 2.7 g/dL (ref 1.5–4.5)
Glucose: 115 mg/dL — ABNORMAL HIGH (ref 70–99)
Potassium: 4.8 mmol/L (ref 3.5–5.2)
Sodium: 139 mmol/L (ref 134–144)
Total Protein: 7.1 g/dL (ref 6.0–8.5)
eGFR: 111 mL/min/{1.73_m2} (ref >59)

## 2022-02-14 LAB — CBC WITH DIFFERENTIAL/PLATELET
Basophils Absolute: 0.1 10*3/uL (ref 0.0–0.2)
Basos: 1 %
EOS (ABSOLUTE): 0.2 10*3/uL (ref 0.0–0.4)
Eos: 3 %
Hematocrit: 46.7 % (ref 37.5–51.0)
Hemoglobin: 15.8 g/dL (ref 13.0–17.7)
Immature Grans (Abs): 0.1 10*3/uL (ref 0.0–0.1)
Immature Granulocytes: 1 %
Lymphocytes Absolute: 2.2 10*3/uL (ref 0.7–3.1)
Lymphs: 27 %
MCH: 30.9 pg (ref 26.6–33.0)
MCHC: 33.8 g/dL (ref 31.5–35.7)
MCV: 91 fL (ref 79–97)
Monocytes Absolute: 0.7 10*3/uL (ref 0.1–0.9)
Monocytes: 8 %
Neutrophils Absolute: 4.9 10*3/uL (ref 1.4–7.0)
Neutrophils: 60 %
Platelets: 190 10*3/uL (ref 150–450)
RBC: 5.11 x10E6/uL (ref 4.14–5.80)
RDW: 12.2 % (ref 11.6–15.4)
WBC: 8.2 10*3/uL (ref 3.4–10.8)

## 2022-02-14 LAB — LIPID PANEL
Chol/HDL Ratio: 6.5 ratio — ABNORMAL HIGH (ref 0.0–5.0)
Cholesterol, Total: 253 mg/dL — ABNORMAL HIGH (ref 100–199)
HDL: 39 mg/dL — ABNORMAL LOW (ref >39)
LDL Chol Calc (NIH): 183 mg/dL — ABNORMAL HIGH (ref 0–99)
Triglycerides: 166 mg/dL — ABNORMAL HIGH (ref 0–149)
VLDL Cholesterol Cal: 31 mg/dL (ref 5–40)

## 2022-02-14 LAB — TSH+FREE T4
Free T4: 0.86 ng/dL (ref 0.82–1.77)
TSH: 1.57 u[IU]/mL (ref 0.450–4.500)

## 2022-02-26 ENCOUNTER — Ambulatory Visit (INDEPENDENT_AMBULATORY_CARE_PROVIDER_SITE_OTHER): Payer: 59 | Admitting: Family Medicine

## 2022-02-26 ENCOUNTER — Encounter: Payer: Self-pay | Admitting: Family Medicine

## 2022-02-26 VITALS — BP 124/79 | HR 74 | Temp 97.3°F | Ht 71.0 in | Wt 241.0 lb

## 2022-02-26 DIAGNOSIS — R7401 Elevation of levels of liver transaminase levels: Secondary | ICD-10-CM

## 2022-02-26 DIAGNOSIS — R079 Chest pain, unspecified: Secondary | ICD-10-CM

## 2022-02-26 DIAGNOSIS — I10 Essential (primary) hypertension: Secondary | ICD-10-CM

## 2022-02-26 LAB — CMP14+EGFR
ALT: 104 IU/L — ABNORMAL HIGH (ref 0–44)
AST: 51 IU/L — ABNORMAL HIGH (ref 0–40)
Albumin/Globulin Ratio: 1.7 (ref 1.2–2.2)
Albumin: 4.8 g/dL (ref 4.1–5.1)
Alkaline Phosphatase: 107 IU/L (ref 44–121)
BUN/Creatinine Ratio: 12 (ref 9–20)
BUN: 12 mg/dL (ref 6–24)
Bilirubin Total: 0.6 mg/dL (ref 0.0–1.2)
CO2: 22 mmol/L (ref 20–29)
Calcium: 10.1 mg/dL (ref 8.7–10.2)
Chloride: 100 mmol/L (ref 96–106)
Creatinine, Ser: 1.03 mg/dL (ref 0.76–1.27)
Globulin, Total: 2.9 g/dL (ref 1.5–4.5)
Glucose: 116 mg/dL — ABNORMAL HIGH (ref 70–99)
Potassium: 4.9 mmol/L (ref 3.5–5.2)
Sodium: 139 mmol/L (ref 134–144)
Total Protein: 7.7 g/dL (ref 6.0–8.5)
eGFR: 92 mL/min/{1.73_m2} (ref >59)

## 2022-02-26 MED ORDER — LISINOPRIL 20 MG PO TABS: 20.0000 mg | ORAL_TABLET | Freq: Every day | ORAL | 1 refills | Status: DC

## 2022-02-26 NOTE — Progress Notes (Signed)
Subjective:  Patient ID: Kevin Berg, male    DOB: May 12, 1980  Age: 42 y.o. MRN: [id number]  CC: Hypertension   HPI [facility] presents for  follow-up of hypertension. Patient has no history of headache chest pain or shortness of breath or recent cough. Patient also denies symptoms of TIA such as focal numbness or weakness. Patient denies side effects from medication. States taking it regularly. Home readings running 150-160/ 90-105. Checking daily. High stress at work.  Patient in for follow-up of GERD. Currently asymptomatic taking  PPI daily. There is no chest pain or heartburn. No hematemesis and no melena. No dysphagia or choking. Onset is remote. Progression is stable. Complicating factors, none.    History Kendyl has a past medical history of Alcohol abuse, Anxiety and depression, Arthritis, GERD (gastroesophageal reflux disease), H/O ETOH abuse (09/19/2013), Hyperlipemia, Hypersomnia, 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   He has a past surgical history that includes Knee surgery (Left, 2014) and Meniscus repair (2014).   His family history includes Arthritis in his mother; Diabetes in his father; Heart disease in his maternal grandmother; Hypertension in his father and mother; Post-traumatic stress disorder in his brother.He reports that he has never smoked. His smokeless tobacco use includes chew. He reports current alcohol use of about 24.0 standard drinks of alcohol per week. He reports that he does not use drugs.  Current Outpatient Medications on File Prior to Visit  Medication Sig Dispense Refill   Multiple Vitamin (MULTI VITAMIN MENS PO) Take by mouth.       Omeprazole Magnesium (PRILOSEC PO)      No current facility-administered medications on file prior to visit.    ROS Review of Systems  Constitutional:  Negative for fever.  Respiratory:  Negative for shortness of breath.   Cardiovascular:  Negative for chest pain.  Musculoskeletal:  Negative  for arthralgias.  Skin:  Negative for rash.    Objective:  BP 124/79   Pulse 74   Temp (!) 97.3 F (36.3 C)   Ht 5\' 11"  (1.803 m)   Wt 241 lb (109.3 kg)   SpO2 94%   BMI 33.61 kg/m   BP Readings from Last 3 Encounters:  02/26/22 124/79  02/13/22 (!) 162/96  01/30/22 (!) 165/108    Wt Readings from Last 3 Encounters:  02/26/22 241 lb (109.3 kg)  02/13/22 246 lb (111.6 kg)  01/30/22 242 lb 12.8 oz (110.1 kg)     Physical Exam Vitals reviewed.  Constitutional:      Appearance: He is well-developed.  HENT:     Head: Normocephalic and atraumatic.     Right Ear: External ear normal.     Left Ear: External ear normal.     Mouth/Throat:     Pharynx: No oropharyngeal exudate or posterior oropharyngeal erythema.  Eyes:     Pupils: Pupils are equal, round, and reactive to light.  Cardiovascular:     Rate and Rhythm: Normal rate and regular rhythm.     Heart sounds: No murmur heard. Pulmonary:     Effort: No respiratory distress.     Breath sounds: Normal breath sounds.  Musculoskeletal:     Cervical back: Normal range of motion and neck supple.  Neurological:     Mental Status: He is alert and oriented to person, place, and time.       Assessment & Plan:   Marsden was seen today for hypertension.  Diagnoses and all orders for this visit:  Primary hypertension -  lisinopril (ZESTRIL) 20 MG tablet; Take 1 tablet (20 mg total) by mouth daily. -     CMP14+EGFR  Elevated liver transaminase level -     CMP14+EGFR  Chest pain with high risk for cardiac etiology -     Ambulatory referral to Cardiology   Allergies as of 02/26/2022   No Known Allergies      Medication List        Accurate as of February 26, 2022 12:39 PM. If you have any questions, ask your nurse or doctor.          STOP taking these medications    albuterol 108 (90 Base) MCG/ACT inhaler Commonly known as: VENTOLIN HFA Stopped by: Claretta Fraise, MD   scopolamine 1  MG/3DAYS Commonly known as: Transderm-Scop Stopped by: Claretta Fraise, MD       TAKE these medications    lisinopril 20 MG tablet Commonly known as: ZESTRIL Take 1 tablet (20 mg total) by mouth daily. What changed:  medication strength how much to take Changed by: Claretta Fraise, MD   MULTI VITAMIN MENS PO Take by mouth.   PRILOSEC PO        Meds ordered this encounter  Medications   lisinopril (ZESTRIL) 20 MG tablet    Sig: Take 1 tablet (20 mg total) by mouth daily.    Dispense:  90 tablet    Refill:  1    Monitor home BP for goal under 130/80. One week off work for stress reduction.  Follow-up: Return in about 3 months (around 05/27/2022).  Claretta Fraise, M.D.

## 2022-03-05 NOTE — Progress Notes (Signed)
Hello Kevin Berg,  Your lab result is normal and/or stable.Some minor variations that are not significant are commonly marked abnormal, but do not represent any medical problem for you.  Best regards, Claretta Fraise, M.D.

## 2022-03-12 ENCOUNTER — Telehealth: Payer: Self-pay | Admitting: Family Medicine

## 2022-03-12 NOTE — Telephone Encounter (Signed)
Called patient, no answer, left message to return call, also sent Dr Livia Snellen response in a MyChart message.

## 2022-03-12 NOTE — Telephone Encounter (Signed)
DR Livia Snellen RESPONSE SENT VIA [facility] MESSAGE

## 2022-03-12 NOTE — Telephone Encounter (Signed)
Reviewed Dr Livia Snellen notes with patient. Patient wants to know what he can do to help get his liver enzymes back to normal or close to normal?

## 2022-03-12 NOTE — Telephone Encounter (Signed)
Relax, they are trending that way. They should be back to normal in a few weeks. They are not a problem medically!

## 2022-03-12 NOTE — Telephone Encounter (Signed)
The liver enzymes are better, but not back to normal.  Let's check once more in March. They don't have to be normal , but I would like to see them as close as possible to normal before starting the cholesterol pill.

## 2022-03-12 NOTE — Telephone Encounter (Signed)
Pt called wanting to know if PCP is going to put him on cholesterol medicine? Says it was discussed after he had his lab work done in January and then Dr Livia Snellen wanted him to come back in February to recheck liver before putting him on cholesterol medicine. Pt hasn't heard back about if the cholesterol medicine is going to be sent in for him or not.   Please advise and call patient with an update.

## 2022-03-21 ENCOUNTER — Ambulatory Visit: Payer: Managed Care, Other (non HMO) | Admitting: Cardiovascular Disease

## 2022-03-26 ENCOUNTER — Ambulatory Visit: Payer: No Typology Code available for payment source | Attending: Cardiovascular Disease | Admitting: Cardiology

## 2022-03-26 ENCOUNTER — Encounter: Payer: Self-pay | Admitting: Cardiology

## 2022-03-26 ENCOUNTER — Other Ambulatory Visit: Payer: Self-pay | Admitting: *Deleted

## 2022-03-26 VITALS — BP 132/80 | HR 90 | Ht 71.0 in | Wt 242.2 lb

## 2022-03-26 DIAGNOSIS — E782 Mixed hyperlipidemia: Secondary | ICD-10-CM | POA: Diagnosis not present

## 2022-03-26 DIAGNOSIS — R072 Precordial pain: Secondary | ICD-10-CM

## 2022-03-26 DIAGNOSIS — I10 Essential (primary) hypertension: Secondary | ICD-10-CM

## 2022-03-26 MED ORDER — METOPROLOL TARTRATE 100 MG PO TABS: ORAL_TABLET | ORAL | 0 refills | Status: DC

## 2022-03-26 NOTE — Progress Notes (Signed)
Cardiology Office Note:    Date:  03/26/2022   ID:  DRESHON SCHEY, DOB 1980-10-15, MRN [id number]  PCP:  Claretta Fraise, MD   [place] Providers Cardiologist:  None     Referring MD: Claretta Fraise, MD    History of Present Illness:    Kevin Berg is a 42 y.o. male here for the evaluation of chest tightness, left arm discomfort concerning for anginal symptoms in the setting of strong family history at the request of Dr. Livia Snellen.  Recent visit with his primary care physician resulted in initiation of lisinopril 20 mg a day.  This is helped considerably with his blood pressure.  He has been under increased stress recently.  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  Insurance is changing as well.    He has noted some shortness of breath with activity which she is attributing to being out of shape, he notices the chest tightness and left arm discomfort when under stress or potentially exertion.  Relieved with rest.    Many family members on his mother side had heart disease.    He also has elevated cholesterol, LDL was 183, ALT however was 104.  Does admit that he enjoys beer.  This has been discussed.     Past Medical History:  Diagnosis Date   Alcohol abuse    Anxiety and depression    Arthritis    GERD (gastroesophageal reflux disease)    H/O ETOH abuse 09/19/2013   Hyperlipemia    Hypersomnia, persistent 09/19/2013   Snorings 09/19/2013    Past Surgical History:  Procedure Laterality Date   KNEE SURGERY Left 2014   MENISCUS REPAIR  2014    Current Medications: Current Meds  Medication Sig   lisinopril (ZESTRIL) 20 MG tablet Take 1 tablet (20 mg total) by mouth daily.   metoprolol tartrate (LOPRESSOR) 100 MG tablet Take one (1) tablet by mouth ( 100 mg) 2 hours prior to CT scan.   Multiple Vitamin (MULTI VITAMIN MENS PO) Take by mouth.     Omeprazole Magnesium (PRILOSEC PO)       Allergies:   Patient has no known allergies.   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 Patient is working at [employer] Engineer, materials). Patient has a high school education. Patient is right-handed. Patient drinks two cups of coffee every morning, one 20 0z of    sodas daily and tea not quite as often.   Social Determinants of Health   Financial Resource Strain: Not on file  Food  Insecurity: Not on file  Transportation Needs: Not on file  Physical Activity: Not on file  Stress: Not on file  Social Connections: Not on file     Family History: The patient's family history includes Arthritis in his mother; Diabetes in his father; Heart disease in his maternal grandmother; Hypertension in his father and mother; Post-traumatic stress disorder in his brother.  ROS:   Please see the history of present illness.    Denies any fevers chills nausea vomiting syncope bleeding all other systems reviewed and are negative.  EKGs/Labs/Other Studies Reviewed:    The following studies were reviewed today: Prior office notes reviewed, lab work reviewed  EKG: 03/26/2022 normal sinus rhythm 83  Recent Labs: 02/13/2022: Hemoglobin 15.8; Platelets 190; TSH 1.570 02/26/2022: ALT 104; BUN 12; Creatinine, Ser  1.03; Potassium 4.9; Sodium 139  Recent Lipid Panel    Component Value Date/Time   CHOL 253 (H) 02/13/2022 0905   TRIG 166 (H) 02/13/2022 0905   HDL 39 (L) 02/13/2022 0905   CHOLHDL 6.5 (H) 02/13/2022 0905   LDLCALC 183 (H) 02/13/2022 0905     Risk Assessment/Calculations:               Physical Exam:    VS:  BP 132/80   Pulse 90   Ht '5\' 11"'$  (1.803 m)   Wt 242 lb 3.2 oz (109.9 kg)   SpO2 94%   BMI 33.78 kg/m     Wt Readings from Last 3 Encounters:  03/26/22 242 lb 3.2 oz (109.9 kg)  02/26/22 241 lb (109.3 kg)  02/13/22 246 lb (111.6 kg)     GEN:  Well nourished, well developed in no acute distress HEENT: Normal NECK: No JVD; No carotid bruits LYMPHATICS: No lymphadenopathy CARDIAC: RRR, no murmurs, rubs, gallops RESPIRATORY:  Clear to auscultation without rales, wheezing or rhonchi  ABDOMEN: Soft, non-tender, non-distended MUSCULOSKELETAL:  No edema; No deformity  SKIN: Warm and dry NEUROLOGIC:  Alert and oriented x 3 PSYCHIATRIC:  Normal affect   ASSESSMENT:    1. Precordial pain   2. Mixed hyperlipidemia   3. Primary hypertension    PLAN:    In order of problems listed above:  Precordial chest pain, left arm pain possible anginal symptoms - We will go ahead and check a coronary CT scan.  This will be helpful to identify any plaque or severe stenosis that may be present.  Further treatment strategies based upon scan. - Discussed lifestyle modifications especially decreasing alcohol intake, exercise, fruits vegetables.  Hypertension - Better control with lisinopril 20 mg a day.  Continue.  Last creatinine was 1.03.  Hyperlipidemia mixed - Triglycerides 166 LDL 183 total cholesterol 253 HDL 39.  Likely in part familial.  Dietary modifications.  If coronary plaque is present, strongly recommend statin type therapy.  However there is hesitation at this point given his elevated ALT of 104 checked on 02/26/2022.  This may be alcohol related.           Medication Adjustments/Labs and Tests Ordered: Current medicines are reviewed at length with the patient today.  Concerns regarding medicines are outlined above.  Orders Placed This Encounter  Procedures   CT CORONARY MORPH W/CTA COR W/SCORE W/CA W/CM &/OR WO/CM   Basic Metabolic Panel (BMET)   Basic Metabolic Panel (BMET)   EKG 12-Lead   Meds ordered this encounter  Medications   metoprolol tartrate (LOPRESSOR) 100 MG tablet    Sig: Take one (1) tablet by mouth ( 100 mg)  2 hours prior to CT scan.    Dispense:  1 tablet    Refill:  0    Patient Instructions  Medication Instructions:   Your physician recommends that you continue on your current medications as directed. Please refer to the Current Medication list given to you today.   *If you need a refill on your cardiac medications before your next appointment, please call your pharmacy*   Lab Work:  TODAY!!! BMET  If you have labs (blood work) drawn today and your tests are completely normal, you will receive your results only by: [place] (if you have MyChart) OR A paper copy in the mail If you have any lab test that is abnormal or we need to change your treatment, we will call you to review the results.   Testing/Procedures:    Your cardiac CT will be scheduled at one of the below locations:   [facility] [address] [place], [place] [address] [phone]  If scheduled at [facility], please arrive at the [facility] and Children's Entrance (Entrance C2) of [facility] 30 minutes prior to test start time. You can use the FREE valet parking offered at entrance C (encouraged to control the heart rate for the test)  Proceed to the [facility] Radiology Department (first floor) to check-in and test prep.  All radiology patients and guests should use entrance C2 at [facility], accessed from [facility], even though the hospital's physical address listed is  [address]    Please follow these instructions carefully (unless otherwise directed):  Hold all erectile dysfunction medications at least 3 days (72 hrs) prior to test. (Ie viagra, cialis, sildenafil, tadalafil, etc) We will administer nitroglycerin during this exam.   On the Night Before the Test: Be sure to Drink plenty of water. Do not consume any caffeinated/decaffeinated beverages or chocolate 12 hours prior to your test. Do not take any antihistamines 12 hours prior to your test.   On the Day of the Test: Drink plenty of water until 1 hour prior to the test. Do not eat any food 1 hour prior to test. You may take your regular medications prior to the test.  Take metoprolol (Lopressor) one (1) tablet two hours prior to test.     After the Test: Drink plenty of water. After receiving IV contrast, you may experience a mild flushed feeling. This is normal. On occasion, you may experience a mild rash up to 24 hours after the test. This is not dangerous. If this occurs, you can take Benadryl 25 mg and increase your fluid intake. If you experience trouble breathing, this can be serious. If it is severe call 911 IMMEDIATELY. If it is mild, please call our office.  We will call to schedule your test 2-4 weeks out understanding that some insurance companies will need an authorization prior to the service being performed.   For non-scheduling related questions, please contact the cardiac imaging nurse navigator should you have any questions/concerns: Marchia Bond, Cardiac Imaging Nurse Navigator Gordy Clement, Cardiac Imaging Nurse Navigator [facility] Direct Office Dial: [phone]   For scheduling needs, including cancellations and rescheduling, please call [place], [phone].    Follow-Up: At [facility], you and your health needs are our priority.  As part of our continuing mission to provide you with exceptional heart  care, we have created designated Provider Care Teams.  These Care Teams include your primary  Cardiologist (physician) and Advanced Practice Providers (APPs -  Physician Assistants and Nurse Practitioners) who all work together to provide you with the care you need, when you need it.  We recommend signing up for the patient portal called "MyChart".  Sign up information is provided on this After Visit Summary.  MyChart is used to connect with patients for Virtual Visits (Telemedicine).  Patients are able to view lab/test results, encounter notes, upcoming appointments, etc.  Non-urgent messages can be sent to your provider as well.   To learn more about what you can do with MyChart, go to [email or web address].    Your next appointment:   As needed pending on test results.      Signed, Candee Furbish, MD  03/26/2022 1:40 PM    [facility]

## 2022-03-26 NOTE — Patient Instructions (Signed)
Medication Instructions:   Your physician recommends that you continue on your current medications as directed. Please refer to the Current Medication list given to you today.   *If you need a refill on your cardiac medications before your next appointment, please call your pharmacy*   Lab Work:  TODAY!!! BMET  If you have labs (blood work) drawn today and your tests are completely normal, you will receive your results only by: [place] (if you have MyChart) OR A paper copy in the mail If you have any lab test that is abnormal or we need to change your treatment, we will call you to review the results.   Testing/Procedures:    Your cardiac CT will be scheduled at one of the below locations:   [facility] [address] [place], [place] [address] [phone]  If scheduled at [facility], please arrive at the [facility] and Children's Entrance (Entrance C2) of [facility] 30 minutes prior to test start time. You can use the FREE valet parking offered at entrance C (encouraged to control the heart rate for the test)  Proceed to the [facility] Radiology Department (first floor) to check-in and test prep.  All radiology patients and guests should use entrance C2 at [facility], accessed from [facility], even though the hospital's physical address listed is [address]    Please follow these instructions carefully (unless otherwise directed):  Hold all erectile dysfunction medications at least 3 days (72 hrs) prior to test. (Ie viagra, cialis, sildenafil, tadalafil, etc) We will administer nitroglycerin during this exam.   On the Night Before the Test: Be sure to Drink plenty of water. Do not consume any caffeinated/decaffeinated beverages or chocolate 12 hours prior to your test. Do not take any antihistamines 12 hours prior to your test.   On the Day of the Test: Drink plenty of water until 1 hour prior to  the test. Do not eat any food 1 hour prior to test. You may take your regular medications prior to the test.  Take metoprolol (Lopressor) one (1) tablet two hours prior to test.     After the Test: Drink plenty of water. After receiving IV contrast, you may experience a mild flushed feeling. This is normal. On occasion, you may experience a mild rash up to 24 hours after the test. This is not dangerous. If this occurs, you can take Benadryl 25 mg and increase your fluid intake. If you experience trouble breathing, this can be serious. If it is severe call 911 IMMEDIATELY. If it is mild, please call our office.  We will call to schedule your test 2-4 weeks out understanding that some insurance companies will need an authorization prior to the service being performed.   For non-scheduling related questions, please contact the cardiac imaging nurse navigator should you have any questions/concerns: Marchia Bond, Cardiac Imaging Nurse Navigator Gordy Clement, Cardiac Imaging Nurse Navigator [facility] [facility] Direct Office Dial: [phone]   For scheduling needs, including cancellations and rescheduling, please call [place], [phone].    Follow-Up: At [facility], you and your health needs are our priority.  As part of our continuing mission to provide you with exceptional heart care, we have created designated Provider Care Teams.  These Care Teams include your primary Cardiologist (physician) and Advanced Practice Providers (APPs -  Physician Assistants and Nurse Practitioners) who all work together to provide you with the care you need,  when you need it.  We recommend signing up for the patient portal called "MyChart".  Sign up information is provided on this After Visit Summary.  MyChart is used to connect with patients for Virtual Visits (Telemedicine).  Patients are able to view lab/test results, encounter notes, upcoming appointments, etc.  Non-urgent  messages can be sent to your provider as well.   To learn more about what you can do with MyChart, go to [email or web address].    Your next appointment:   As needed pending on test results.

## 2022-03-27 ENCOUNTER — Other Ambulatory Visit: Payer: 59

## 2022-03-28 LAB — BASIC METABOLIC PANEL
BUN/Creatinine Ratio: 15 (ref 9–20)
BUN: 15 mg/dL (ref 6–24)
CO2: 21 mmol/L (ref 20–29)
Calcium: 10 mg/dL (ref 8.7–10.2)
Chloride: 102 mmol/L (ref 96–106)
Creatinine, Ser: 0.97 mg/dL (ref 0.76–1.27)
Glucose: 108 mg/dL — ABNORMAL HIGH (ref 70–99)
Potassium: 5 mmol/L (ref 3.5–5.2)
Sodium: 140 mmol/L (ref 134–144)
eGFR: 99 mL/min/{1.73_m2} (ref >59)

## 2022-04-02 ENCOUNTER — Telehealth (HOSPITAL_COMMUNITY): Payer: Self-pay | Admitting: *Deleted

## 2022-04-02 NOTE — Telephone Encounter (Signed)
Attempted to call patient regarding upcoming cardiac CT appointment. °Left message on voicemail with name and callback number ° °Johnattan Strassman RN Navigator Cardiac Imaging °[facility] °[phone] Office °[phone] Cell ° °

## 2022-04-02 NOTE — Telephone Encounter (Signed)
Patient returning call about upcoming cardiac imaging study; pt verbalizes understanding of appt date/time, parking situation and where to check in, pre-test NPO status and medications ordered, and verified current allergies; name and call back number provided for further questions should they arise  Kevin Clement RN Navigator Cardiac Imaging [facility] [phone] office [phone] cell  Patient to take '100mg'$  metoprolol tartrate two hours prior to his cardiac CT scan.  He is aware to arrive at 7:30am.

## 2022-04-03 ENCOUNTER — Ambulatory Visit (HOSPITAL_COMMUNITY)
Admission: RE | Admit: 2022-04-03 | Discharge: 2022-04-03 | Disposition: A | Discharge status: Home / Self Care | Payer: 59 | Source: Ambulatory Visit | Attending: Cardiology | Admitting: Cardiology

## 2022-04-03 DIAGNOSIS — R072 Precordial pain: Secondary | ICD-10-CM | POA: Diagnosis present

## 2022-04-03 MED ORDER — NITROGLYCERIN 0.4 MG SL SUBL
SUBLINGUAL_TABLET | SUBLINGUAL | Status: AC
  Administered 2022-04-03: 0.8 mg via SUBLINGUAL
  Filled 2022-04-03: qty 2

## 2022-04-03 MED ORDER — IOHEXOL 350 MG/ML SOLN
100.0000 mL | Freq: Once | INTRAVENOUS | Status: AC | PRN
  Administered 2022-04-03: 100 mL via INTRAVENOUS

## 2022-04-03 MED ORDER — NITROGLYCERIN 0.4 MG SL SUBL: 0.8000 mg | SUBLINGUAL_TABLET | Freq: Once | SUBLINGUAL | Status: AC

## 2022-04-08 ENCOUNTER — Telehealth: Payer: 59 | Admitting: Family Medicine

## 2022-04-08 DIAGNOSIS — J069 Acute upper respiratory infection, unspecified: Secondary | ICD-10-CM

## 2022-04-08 MED ORDER — BENZONATATE 100 MG PO CAPS: 200.0000 mg | ORAL_CAPSULE | Freq: Two times a day (BID) | ORAL | 0 refills | Status: DC | PRN

## 2022-04-08 MED ORDER — FLUTICASONE PROPIONATE 50 MCG/ACT NA SUSP: 2.0000 | Freq: Every day | NASAL | 0 refills | Status: DC

## 2022-04-08 MED ORDER — PROMETHAZINE-DM 6.25-15 MG/5ML PO SYRP: 5.0000 mL | ORAL_SOLUTION | Freq: Four times a day (QID) | ORAL | 0 refills | Status: DC | PRN

## 2022-04-08 NOTE — Patient Instructions (Addendum)
[facility], thank you for joining Perlie Mayo, NP for today's virtual visit.  While this provider is not your primary care provider (PCP), if your PCP is located in our provider database this encounter information will be shared with them immediately following your visit.   [facility] account gives you access to today's visit and all your visits, tests, and labs performed at [facility] " click here if you don't have a [facility] account or go to [email or web address]  Consent: (Patient) Kevin Berg provided verbal consent for this virtual visit at the beginning of the encounter.  Current Medications:  Current Outpatient Medications:    benzonatate (TESSALON) 100 MG capsule, Take 2 capsules (200 mg total) by mouth 2 (two) times daily as needed for cough., Disp: 30 capsule, Rfl: 0   fluticasone (FLONASE) 50 MCG/ACT nasal spray, Place 2 sprays into both nostrils daily., Disp: 16 g, Rfl: 0   promethazine-dextromethorphan (PROMETHAZINE-DM) 6.25-15 MG/5ML syrup, Take 5 mLs by mouth 4 (four) times daily as needed for cough., Disp: 118 mL, Rfl: 0   lisinopril (ZESTRIL) 20 MG tablet, Take 1 tablet (20 mg total) by mouth daily., Disp: 90 tablet, Rfl: 1   metoprolol tartrate (LOPRESSOR) 100 MG tablet, Take one (1) tablet by mouth ( 100 mg) 2 hours prior to CT scan., Disp: 1 tablet, Rfl: 0   Multiple Vitamin (MULTI VITAMIN MENS PO), Take by mouth.  , Disp: , Rfl:    Omeprazole Magnesium (PRILOSEC PO), , Disp: , Rfl:    Medications ordered in this encounter:  Meds ordered this encounter  Medications   fluticasone (FLONASE) 50 MCG/ACT nasal spray    Sig: Place 2 sprays into both nostrils daily.    Dispense:  16 g    Refill:  0    Order Specific Question:   Supervising Provider    Answer:   Chase Picket [id number]   benzonatate (TESSALON) 100 MG capsule    Sig: Take 2 capsules (200 mg total) by mouth 2 (two) times daily as needed for cough.     Dispense:  30 capsule    Refill:  0    Order Specific Question:   Supervising Provider    Answer:   Chase Picket [id number]   promethazine-dextromethorphan (PROMETHAZINE-DM) 6.25-15 MG/5ML syrup    Sig: Take 5 mLs by mouth 4 (four) times daily as needed for cough.    Dispense:  118 mL    Refill:  0    Order Specific Question:   Supervising Provider    Answer:   Chase Picket [id number]     *If you need refills on other medications prior to your next appointment, please contact your pharmacy*  Follow-Up: Call back or seek an in-person evaluation if the symptoms worsen or if the condition fails to improve as anticipated.  [facility] [phone]  Other Instructions -Take meds as prescribed -Rest  - Use saline nose sprays frequently to help soothe nasal passages and promote drainage. -Saline irrigations of the nose can be very helpful if done frequently.             * 4X daily for 1 week*             * Use of a nettie pot can be helpful with this.  *Follow directions with this* *Boiled or distilled water only -stay hydrated by drinking plenty of fluids    If you have been instructed to have  an in-person evaluation today at a local Urgent Care facility, please use the link below. It will take you to a list of all of our available [facility], including address, phone number and hours of operation. Please do not delay care.  [facility]  If you or a family member do not have a primary care provider, use the link below to schedule a visit and establish care. When you choose a [facility] primary care physician or advanced practice provider, you gain a long-term partner in health. Find a Primary Care Provider  Learn more about [facility]'s in-office and virtual care options: [facility]

## 2022-04-08 NOTE — Progress Notes (Signed)
Virtual Visit Consent   RAIZO CAZES, you are scheduled for a virtual visit with a [facility] provider today. Just as with appointments in the office, your consent must be obtained to participate. Your consent will be active for this visit and any virtual visit you may have with one of our providers in the next 365 days. If you have a MyChart account, a copy of this consent can be sent to you electronically.  As this is a virtual visit, video technology does not allow for your provider to perform a traditional examination. This may limit your provider's ability to fully assess your condition. If your provider identifies any concerns that need to be evaluated in person or the need to arrange testing (such as labs, EKG, etc.), we will make arrangements to do so. Although advances in technology are sophisticated, we cannot ensure that it will always work on either your end or our end. If the connection with a video visit is poor, the visit may have to be switched to a telephone visit. With either a video or telephone visit, we are not always able to ensure that we have a secure connection.  By engaging in this virtual visit, you consent to the provision of healthcare and authorize for your insurance to be billed (if applicable) for the services provided during this visit. Depending on your insurance coverage, you may receive a charge related to this service.  I need to obtain your verbal consent now. Are you willing to proceed with your visit today? Kevin Berg has provided verbal consent on 04/08/2022 for a virtual visit (video or telephone). Perlie Mayo, NP  Date: 04/08/2022 12:03 PM  Virtual Visit via Video Note   I, Perlie Mayo, connected with  Kevin Berg  ([id number], February 44, 1982) on 04/08/22 at 12:00 PM EDT by a video-enabled telemedicine application and verified that I am speaking with the correct person using two identifiers.  Location: Patient: Virtual Visit Location Patient:  Home Provider: Virtual Visit Location Provider: Home Office   I discussed the limitations of evaluation and management by telemedicine and the availability of in person appointments. The patient expressed understanding and agreed to proceed.    History of Present Illness: Kevin Berg is a 42 y.o. who identifies as a male who was assigned male at birth, and is being seen today for cough and congestion.  Onset was Saturday developed cough and congestion for the last 4  Associated symptoms are fever for 2-3 of those days ended this morning Modifying factors are mucinex, inhaler and ny quil- helped some Denies chest pain, shortness of breath, fevers, chills  Exposure to sick contacts- known COVID test: no Vaccines: no   Problems:  Patient Active Problem List   Diagnosis Date Noted   Primary hypertension 02/13/2022    Allergies: No Known Allergies Medications:  Current Outpatient Medications:    lisinopril (ZESTRIL) 20 MG tablet, Take 1 tablet (20 mg total) by mouth daily., Disp: 90 tablet, Rfl: 1   metoprolol tartrate (LOPRESSOR) 100 MG tablet, Take one (1) tablet by mouth ( 100 mg) 2 hours prior to CT scan., Disp: 1 tablet, Rfl: 0   Multiple Vitamin (MULTI VITAMIN MENS PO), Take by mouth.  , Disp: , Rfl:    Omeprazole Magnesium (PRILOSEC PO), , Disp: , Rfl:   Observations/Objective: Patient is well-developed, well-nourished in no acute distress.  Resting comfortably  at home.  Head is normocephalic, atraumatic.  No labored breathing.  Speech is  clear and coherent with logical content.  Patient is alert and oriented at baseline.    Assessment and Plan:  1. Viral URI with cough  - fluticasone (FLONASE) 50 MCG/ACT nasal spray; Place 2 sprays into both nostrils daily.  Dispense: 16 g; Refill: 0 - benzonatate (TESSALON) 100 MG capsule; Take 2 capsules (200 mg total) by mouth 2 (two) times daily as needed for cough.  Dispense: 30 capsule; Refill: 0 -  promethazine-dextromethorphan (PROMETHAZINE-DM) 6.25-15 MG/5ML syrup; Take 5 mLs by mouth 4 (four) times daily as needed for cough.  Dispense: 118 mL; Refill: 0   -Take meds as prescribed -Rest  - Use saline nose sprays frequently to help soothe nasal passages and promote drainage. -Saline irrigations of the nose can be very helpful if done frequently.             * 4X daily for 1 week*             * Use of a nettie pot can be helpful with this.  *Follow directions with this* *Boiled or distilled water only -stay hydrated by drinking plenty of fluids  - For fever or aches or pains- take tylenol or ibuprofen as directed on bottle             * for fevers greater than 101 orally you may alternate ibuprofen and tylenol every 3 hours.  If you do not improve you will need a follow up visit in person.               Reviewed side effects, risks and benefits of medication.    Patient acknowledged agreement and understanding of the plan.   Past Medical, Surgical, Social History, Allergies, and Medications have been Reviewed.    Follow Up Instructions: I discussed the assessment and treatment plan with the patient. The patient was provided an opportunity to ask questions and all were answered. The patient agreed with the plan and demonstrated an understanding of the instructions.  A copy of instructions were sent to the patient via MyChart unless otherwise noted below.    The patient was advised to call back or seek an in-person evaluation if the symptoms worsen or if the condition fails to improve as anticipated.  Time:  I spent 10 minutes with the patient via telehealth technology discussing the above problems/concerns.    Perlie Mayo, NP

## 2022-04-09 ENCOUNTER — Other Ambulatory Visit: Payer: Self-pay | Admitting: Family Medicine

## 2022-04-09 MED ORDER — AMOXICILLIN-POT CLAVULANATE 875-125 MG PO TABS: 1.0000 | ORAL_TABLET | Freq: Two times a day (BID) | ORAL | 0 refills | Status: DC

## 2022-04-10 ENCOUNTER — Ambulatory Visit: Payer: Managed Care, Other (non HMO) | Admitting: Internal Medicine

## 2022-08-27 ENCOUNTER — Other Ambulatory Visit: Payer: Self-pay | Admitting: Family Medicine

## 2022-08-27 DIAGNOSIS — I10 Essential (primary) hypertension: Secondary | ICD-10-CM

## 2022-09-07 ENCOUNTER — Other Ambulatory Visit: Payer: Self-pay | Admitting: Family Medicine

## 2022-09-27 ENCOUNTER — Other Ambulatory Visit: Payer: Self-pay | Admitting: Family Medicine

## 2022-09-27 DIAGNOSIS — I10 Essential (primary) hypertension: Secondary | ICD-10-CM

## 2022-09-30 ENCOUNTER — Other Ambulatory Visit: Payer: Self-pay | Admitting: Family Medicine

## 2022-09-30 DIAGNOSIS — I10 Essential (primary) hypertension: Secondary | ICD-10-CM

## 2022-09-30 MED ORDER — LISINOPRIL 20 MG PO TABS
20.0000 mg | ORAL_TABLET | Freq: Every day | ORAL | 1 refills | Status: DC
Start: 2022-09-30 — End: 2022-11-26

## 2022-09-30 NOTE — Telephone Encounter (Signed)
Pt started a new job and will have insurance in October. Pt scheduled an apt for 11/10/2022 with [facility]. Pt asking for a refill until apt.

## 2022-09-30 NOTE — Addendum Note (Signed)
Addended by: Julious Payer D on: 09/30/2022 02:57 PM   Modules accepted: Orders

## 2022-11-10 ENCOUNTER — Ambulatory Visit: Payer: 59 | Admitting: Family Medicine

## 2022-11-26 ENCOUNTER — Other Ambulatory Visit: Payer: Self-pay | Admitting: Family Medicine

## 2022-11-26 DIAGNOSIS — I10 Essential (primary) hypertension: Secondary | ICD-10-CM

## 2022-12-22 ENCOUNTER — Other Ambulatory Visit: Payer: Self-pay | Admitting: Family Medicine

## 2022-12-22 ENCOUNTER — Encounter: Payer: Self-pay | Admitting: Family Medicine

## 2022-12-22 DIAGNOSIS — I10 Essential (primary) hypertension: Secondary | ICD-10-CM

## 2022-12-22 NOTE — Telephone Encounter (Signed)
[facility] pt NTBS 30-d given 11/26/22

## 2022-12-22 NOTE — Telephone Encounter (Signed)
Called pt to schedule appt N/A NO VM Letter mailed 

## 2022-12-30 ENCOUNTER — Other Ambulatory Visit: Payer: Self-pay | Admitting: *Deleted

## 2022-12-30 ENCOUNTER — Telehealth: Payer: Self-pay | Admitting: Family Medicine

## 2022-12-30 DIAGNOSIS — I10 Essential (primary) hypertension: Secondary | ICD-10-CM

## 2022-12-30 MED ORDER — LISINOPRIL 20 MG PO TABS
20.0000 mg | ORAL_TABLET | Freq: Every day | ORAL | 0 refills | Status: DC
Start: 2022-12-30 — End: 2023-01-26

## 2022-12-30 NOTE — Telephone Encounter (Signed)
REFILL SENT AND APPOINTMENT MOVED UP

## 2022-12-30 NOTE — Telephone Encounter (Unsigned)
Copied from CRM [id number]. Topic: Clinical - Medication Question >> Dec 30, 2022  2:39 PM Joanette Gula wrote: Reason for CRM: PT insurance has ran out and is in need of his lisinopril (ZESTRIL) 20 MG tablet please write him a RX til his next appointment

## 2023-01-26 ENCOUNTER — Encounter: Payer: Self-pay | Admitting: Family Medicine

## 2023-01-26 ENCOUNTER — Ambulatory Visit (INDEPENDENT_AMBULATORY_CARE_PROVIDER_SITE_OTHER): Payer: 59 | Admitting: Family Medicine

## 2023-01-26 VITALS — BP 144/89 | HR 98 | Temp 97.2°F | Ht 71.0 in | Wt 262.0 lb

## 2023-01-26 DIAGNOSIS — K2100 Gastro-esophageal reflux disease with esophagitis, without bleeding: Secondary | ICD-10-CM | POA: Diagnosis not present

## 2023-01-26 DIAGNOSIS — I10 Essential (primary) hypertension: Secondary | ICD-10-CM

## 2023-01-26 MED ORDER — LISINOPRIL 20 MG PO TABS
20.0000 mg | ORAL_TABLET | Freq: Every day | ORAL | 1 refills | Status: DC
Start: 2023-01-26 — End: 2023-01-26

## 2023-01-26 MED ORDER — LISINOPRIL 30 MG PO TABS
30.0000 mg | ORAL_TABLET | Freq: Every day | ORAL | 1 refills | Status: DC
Start: 2023-01-26 — End: 2023-07-17

## 2023-01-26 NOTE — Progress Notes (Signed)
 Subjective:  Patient ID: Kevin Berg, male    DOB: Mar 05, 1980  Age: 43 y.o. MRN: [id number]  CC: Medical Management of Chronic Issues (HTN and refills )   HPI Kevin Berg presents for  follow-up of hypertension. Patient has no history of headache chest pain or shortness of breath or recent cough. Patient also denies symptoms of TIA such as focal numbness or weakness. Patient denies side effects from medication. States taking it regularly. Patient in for follow-up of GERD. Currently asymptomatic taking OTC  PPI daily. There is no chest pain or heartburn. No hematemesis and no melena. No dysphagia or choking. Onset is remote. Progression is stable. Complicating factors, none.   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   He has a past surgical history that includes Knee surgery (Left, 2014) and Meniscus repair (2014).   His family history includes Arthritis in his mother; Diabetes in his father; Heart disease in his maternal grandmother; Hypertension in his father and mother; Post-traumatic stress disorder in his brother.He reports that he has never smoked. His smokeless tobacco use includes chew. He reports current alcohol use of about 24.0 standard drinks of alcohol per week. He reports that he does not use drugs.  Current Outpatient Medications on File Prior to Visit  Medication Sig Dispense Refill   Multiple Vitamin (MULTI VITAMIN MENS PO) Take by mouth.       Omeprazole  Magnesium (PRILOSEC PO)      No current facility-administered medications on file prior to visit.    ROS Review of Systems  Constitutional:  Negative for fever.  Respiratory:  Negative for shortness of breath.   Cardiovascular:  Negative for chest pain.  Musculoskeletal:  Negative for arthralgias.  Skin:   Negative for rash.    Objective:  BP (!) 144/89   Pulse 98   Temp (!) 97.2 F (36.2 C) (Temporal)   Ht 5' 11 (1.803 m)   Wt 262 lb (118.8 kg)   SpO2 95%   BMI 36.54 kg/m   BP Readings from Last 3 Encounters:  01/26/23 (!) 144/89  04/03/22 (!) 120/94  03/26/22 132/80    Wt Readings from Last 3 Encounters:  01/26/23 262 lb (118.8 kg)  03/26/22 242 lb 3.2 oz (109.9 kg)  02/26/22 241 lb (109.3 kg)     Physical Exam Vitals reviewed.  Constitutional:      Appearance: He is well-developed.  HENT:     Head: Normocephalic and atraumatic.     Right Ear: External ear normal.     Left Ear: External ear normal.     Mouth/Throat:     Pharynx: No oropharyngeal exudate or posterior oropharyngeal erythema.  Eyes:     Pupils: Pupils are equal, round, and reactive to light.  Cardiovascular:     Rate and Rhythm: Normal rate and regular rhythm.     Heart sounds: No murmur heard. Pulmonary:     Effort: No respiratory distress.     Breath sounds: Normal breath sounds.  Musculoskeletal:     Cervical back: Normal range of motion and neck supple.  Neurological:     Mental Status: He is alert and oriented to person, place, and time.       Assessment & Plan:   Kevin Berg was seen today for medical management of chronic issues.  Diagnoses and all orders for this visit:  Gastroesophageal  reflux disease with esophagitis without hemorrhage  Primary hypertension -     Discontinue: lisinopril  (ZESTRIL ) 20 MG tablet; Take 1 tablet (20 mg total) by mouth daily. -     lisinopril  (ZESTRIL ) 30 MG tablet; Take 1 tablet (30 mg total) by mouth daily.   Allergies as of 01/26/2023   No Known Allergies      Medication List        Accurate as of January 26, 2023  9:09 PM. If you have any questions, ask your nurse or doctor.          lisinopril  30 MG tablet Commonly known as: ZESTRIL  Take 1 tablet (30 mg total) by mouth daily. What changed:  medication strength how much to  take Changed by: Kevin Berg   MULTI VITAMIN MENS PO Take by mouth.   PRILOSEC PO        Meds ordered this encounter  Medications   DISCONTD: lisinopril  (ZESTRIL ) 20 MG tablet    Sig: Take 1 tablet (20 mg total) by mouth daily.    Dispense:  90 tablet    Refill:  1   lisinopril  (ZESTRIL ) 30 MG tablet    Sig: Take 1 tablet (30 mg total) by mouth daily.    Dispense:  90 tablet    Refill:  1    Cancel all scrips for 20 mg lisinopril . Replace with this.      Follow-up: Return in about 6 months (around 07/26/2023) for [facility] physical.  Kevin Berg, M.D.

## 2023-02-04 ENCOUNTER — Ambulatory Visit: Payer: 59 | Admitting: Family Medicine

## 2023-02-25 ENCOUNTER — Ambulatory Visit: Payer: 59 | Admitting: Family Medicine

## 2023-04-16 ENCOUNTER — Encounter: Payer: Self-pay | Admitting: *Deleted

## 2023-04-27 ENCOUNTER — Telehealth: Payer: Self-pay | Admitting: *Deleted

## 2023-04-27 ENCOUNTER — Other Ambulatory Visit: Payer: Self-pay | Admitting: Family Medicine

## 2023-04-27 DIAGNOSIS — Z1211 Encounter for screening for malignant neoplasm of colon: Secondary | ICD-10-CM

## 2023-04-27 NOTE — Telephone Encounter (Signed)
 Patient reports discussing gastro referral for colonoscopy with Dr Darlyn Read in past but never heard back. I do not see a referral  in chart. His uncle has recently been diagnosed with colon cancer and patient would like to proceed with colonoscopy. Can referral be entered in?

## 2023-04-27 NOTE — Telephone Encounter (Signed)
Referral placed, as requested WS 

## 2023-05-19 ENCOUNTER — Encounter: Payer: Self-pay | Admitting: Gastroenterology

## 2023-07-10 ENCOUNTER — Ambulatory Visit: Payer: Self-pay

## 2023-07-10 VITALS — Ht 71.0 in | Wt 267.0 lb

## 2023-07-10 DIAGNOSIS — Z1211 Encounter for screening for malignant neoplasm of colon: Secondary | ICD-10-CM

## 2023-07-10 MED ORDER — NA SULFATE-K SULFATE-MG SULF 17.5-3.13-1.6 GM/177ML PO SOLN: 1.0000 | Freq: Once | ORAL | 0 refills | Status: AC

## 2023-07-10 NOTE — Progress Notes (Signed)

## 2023-07-17 ENCOUNTER — Other Ambulatory Visit: Payer: Self-pay | Admitting: Family Medicine

## 2023-07-17 DIAGNOSIS — I10 Essential (primary) hypertension: Secondary | ICD-10-CM

## 2023-07-22 ENCOUNTER — Encounter: Admitting: Family Medicine

## 2023-07-28 ENCOUNTER — Encounter: Payer: 59 | Admitting: Family Medicine

## 2023-07-31 ENCOUNTER — Encounter: Payer: Self-pay | Admitting: Gastroenterology

## 2023-09-16 ENCOUNTER — Encounter: Payer: Self-pay | Admitting: Family Medicine

## 2023-09-16 ENCOUNTER — Ambulatory Visit (INDEPENDENT_AMBULATORY_CARE_PROVIDER_SITE_OTHER): Admitting: Family Medicine

## 2023-09-16 ENCOUNTER — Other Ambulatory Visit: Payer: Self-pay | Admitting: Family Medicine

## 2023-09-16 VITALS — BP 112/76 | HR 88 | Temp 97.7°F | Ht 71.0 in | Wt 263.4 lb

## 2023-09-16 DIAGNOSIS — Z0001 Encounter for general adult medical examination with abnormal findings: Secondary | ICD-10-CM | POA: Diagnosis not present

## 2023-09-16 DIAGNOSIS — Z0000 Encounter for general adult medical examination without abnormal findings: Secondary | ICD-10-CM

## 2023-09-16 DIAGNOSIS — K2100 Gastro-esophageal reflux disease with esophagitis, without bleeding: Secondary | ICD-10-CM

## 2023-09-16 DIAGNOSIS — Z125 Encounter for screening for malignant neoplasm of prostate: Secondary | ICD-10-CM

## 2023-09-16 DIAGNOSIS — I10 Essential (primary) hypertension: Secondary | ICD-10-CM

## 2023-09-16 DIAGNOSIS — R351 Nocturia: Secondary | ICD-10-CM | POA: Diagnosis not present

## 2023-09-16 MED ORDER — VALSARTAN 160 MG PO TABS: 160.0000 mg | ORAL_TABLET | Freq: Every day | ORAL | 3 refills | Status: AC

## 2023-09-16 MED ORDER — LANSOPRAZOLE 30 MG PO CPDR: 30.0000 mg | DELAYED_RELEASE_CAPSULE | Freq: Every day | ORAL | 3 refills | Status: DC

## 2023-09-16 NOTE — Progress Notes (Signed)
 Subjective:  Patient ID: Kevin Berg, male    DOB: 1980/11/19  Age: 43 y.o. MRN: [id number]  CC: Annual Exam   HPI  Discussed the use of AI scribe software for clinical note transcription with the patient, who gave verbal consent to proceed.  History of Present Illness Kevin Berg is a 43 year old male who presents for an annual physical exam.  He experiences a numbing sensation in the upper right thigh, described as a 'numbing feeling on the skin' with occasional needle-like pain and burning sensation.  He has increased urination at night, approximately every two to three hours. No chest pain, shortness of breath, nausea, vomiting, diarrhea, or constipation. He has regular bowel movements two to three times a day.  He is actively working on [employer], noting a history of weighing around 225 pounds, which he felt was healthy. He recalls a significant weight loss during a stressful period eight years ago, which made him feel 'sick looking'.  He is currently sexually active with his girlfriend and reports no issues with erectile function. His girlfriend is ten years younger and a nurse, which he feels contributes positively to his sexual health.  He is going to start taking valsartan  for blood pressure management, having noted excessive cough from lisinopril . He monitors his blood pressure at home with a personal device.  Patient reports reflux about 50% of the days in spite of using over-the-counter omeprazole .  Will go ahead with prescription if he can get insurance coverage for it.  I suggested to switch to the lansoprazole  30 mg daily       09/16/2023    3:06 PM 09/16/2023    2:55 PM 01/26/2023    3:53 PM  Depression screen PHQ 2/9  Decreased Interest 0 0 0  Down, Depressed, Hopeless 0 0 0  PHQ - 2 Score 0 0 0  Altered sleeping   0  Tired, decreased energy   0  Change in appetite   0  Feeling bad or failure about yourself    0  Trouble concentrating   0   Moving slowly or fidgety/restless   0  Suicidal thoughts   0  PHQ-9 Score   0  Difficult doing work/chores   Not difficult at all    History Kevin Berg has a past medical history of Alcohol abuse, Anxiety and depression, Arthritis, GERD (gastroesophageal reflux disease), H/O ETOH abuse (09/19/2013), Hyperlipemia, Hypersomnia, 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   He has a past surgical history that includes Knee surgery (Left, 2014) and Meniscus repair (2014).   His family history includes Arthritis in his mother; Colon cancer (age of onset: 63) in his maternal uncle; Diabetes in his father; Heart disease in his maternal grandmother; Hypertension in his father and mother; Post-traumatic stress disorder in his brother.He reports that he has never smoked. His smokeless tobacco use includes chew. He reports current alcohol use of about 24.0 standard drinks of alcohol per week. He reports that he does not use drugs.    ROS Review of Systems  Constitutional:  Negative for activity change, fatigue and unexpected weight change.  HENT:  Negative for congestion, ear pain, hearing loss, postnasal drip and trouble swallowing.   Eyes:  Negative for pain and visual disturbance.  Respiratory:  Negative for cough, chest tightness and shortness of breath.   Cardiovascular:  Negative for chest pain, palpitations and leg swelling.  Gastrointestinal:  Negative for abdominal distention, abdominal pain, blood in stool, constipation, diarrhea, nausea  and vomiting.  Endocrine: Negative for cold intolerance, heat intolerance and polydipsia.  Genitourinary:  Positive for frequency (up to bathroom 2-3 times a night). Negative for difficulty urinating, dysuria, flank pain and urgency.  Musculoskeletal:  Negative for arthralgias and joint swelling.  Skin:  Negative for color change, rash and wound.  Neurological:  Negative for dizziness, syncope, speech difficulty, weakness, light-headedness, numbness and  headaches.  Hematological:  Does not bruise/bleed easily.  Psychiatric/Behavioral:  Negative for confusion, decreased concentration, dysphoric mood and sleep disturbance. The patient is not nervous/anxious.     Objective:  BP 112/76   Pulse 88   Temp 97.7 F (36.5 C)   Ht 5' 11 (1.803 m)   Wt 263 lb 6.4 oz (119.5 kg)   SpO2 97%   BMI 36.74 kg/m   BP Readings from Last 3 Encounters:  09/16/23 112/76  01/26/23 (!) 144/89  04/03/22 (!) 120/94    Wt Readings from Last 3 Encounters:  09/16/23 263 lb 6.4 oz (119.5 kg)  07/10/23 267 lb (121.1 kg)  01/26/23 262 lb (118.8 kg)     Physical Exam Constitutional:      Appearance: He is well-developed.  HENT:     Head: Normocephalic and atraumatic.  Eyes:     Pupils: Pupils are equal, round, and reactive to light.  Neck:     Thyroid: No thyromegaly.     Trachea: No tracheal deviation.  Cardiovascular:     Rate and Rhythm: Normal rate and regular rhythm.     Heart sounds: Normal heart sounds. No murmur heard.    No friction rub. No gallop.  Pulmonary:     Breath sounds: Normal breath sounds. No wheezing or rales.  Abdominal:     General: Bowel sounds are normal. There is no distension.     Palpations: Abdomen is soft. There is no mass.     Tenderness: There is no abdominal tenderness.     Hernia: There is no hernia in the left inguinal area.  Genitourinary:    Penis: Normal.      Testes: Normal.  Musculoskeletal:        General: Normal range of motion.     Cervical back: Normal range of motion.  Lymphadenopathy:     Cervical: No cervical adenopathy.  Skin:    General: Skin is warm and dry.  Neurological:     Mental Status: He is alert and oriented to person, place, and time.      Assessment & Plan:  Annual physical exam -     CBC with Differential/Platelet -     CMP14+EGFR -     Lipid panel -     VITAMIN D  25 Hydroxy (Vit-D Deficiency, Fractures)  Primary hypertension -     CBC with Differential/Platelet -      CMP14+EGFR  Screening for prostate cancer -     PSA, total and free  Nocturia -     PSA, total and free  Gastroesophageal reflux disease with esophagitis without hemorrhage  Other orders -     Valsartan ; Take 1 tablet (160 mg total) by mouth daily. For blood pressure.  Dispense: 90 tablet; Refill: 3 -     Lansoprazole ; Take 1 capsule (30 mg total) by mouth daily at 12 noon.  Dispense: 90 capsule; Refill: 3    Assessment and Plan Assessment & Plan Primary hypertension   Hypertension management requires medication adjustment due to insurance and efficacy considerations. Discontinue lisinopril  and start valsartan . Monitor blood pressure at  home after one week on valsartan . Adjust valsartan  dosage if blood pressure remains elevated after two weeks.  Gastroesophageal reflux disease (GERD)   GERD management requires medication adjustment. Preference for lansoprazole  to minimize fasting periods. Check insurance coverage for lansoprazole . If using omeprazole , increase dose to 40 mg daily. If using lansoprazole , take 30 mg daily.  Meralgia paresthetica, right thigh   Numbness and burning sensation in the upper right thigh due to likely meralgia paresthetica involving the lateral femoral cutaneous nerve. Prescribe a course of prednisone  for two weeks. Advise rest and monitor symptoms. Consider referral to physical therapy if symptoms persist.  Nocturia   Increased nighttime urination frequency, possibly related to fluid intake and weight. Encourage increased water intake during the day. Monitor nocturia symptoms. Consider medical treatment if symptoms persist despite weight loss.  Overweight   Current weight is 263.5 lbs at 5'11. Discussed health benefits of weight loss and set a target weight range of 180-200 lbs. Set a goal to lose 30 lbs in six months. Encourage regular exercise and dietary modifications. Schedule follow-up in six months to assess weight loss progress.  Adult Wellness  Visit   Routine adult wellness visit with no significant abnormalities. Heart and lung sounds normal. No issues with skin or back. Regular bowel movements reported. No chest pain or shortness of breath except during exercise. Sexual health is satisfactory. Perform routine labs as ordered by the nurse. Send Cologuard for colon cancer screening. Perform PSA blood test for prostate cancer screening due to nocturia.  Recording duration: 17 minutes       Follow-up: Return in about 6 months (around 03/18/2024) for hypertension.  Butler Der, M.D.

## 2023-09-17 ENCOUNTER — Ambulatory Visit: Payer: Self-pay | Admitting: Family Medicine

## 2023-09-17 LAB — CBC WITH DIFFERENTIAL/PLATELET
Basophils Absolute: 0.1 x10E3/uL (ref 0.0–0.2)
Basos: 1 %
EOS (ABSOLUTE): 0.5 x10E3/uL — ABNORMAL HIGH (ref 0.0–0.4)
Eos: 4 %
Hematocrit: 49.2 % (ref 37.5–51.0)
Hemoglobin: 16.1 g/dL (ref 13.0–17.7)
Immature Grans (Abs): 0.1 x10E3/uL (ref 0.0–0.1)
Immature Granulocytes: 1 %
Lymphocytes Absolute: 3.2 x10E3/uL — ABNORMAL HIGH (ref 0.7–3.1)
Lymphs: 26 %
MCH: 31.3 pg (ref 26.6–33.0)
MCHC: 32.7 g/dL (ref 31.5–35.7)
MCV: 96 fL (ref 79–97)
Monocytes Absolute: 1 x10E3/uL — ABNORMAL HIGH (ref 0.1–0.9)
Monocytes: 8 %
Neutrophils Absolute: 7.5 x10E3/uL — ABNORMAL HIGH (ref 1.4–7.0)
Neutrophils: 60 %
Platelets: 238 x10E3/uL (ref 150–450)
RBC: 5.14 x10E6/uL (ref 4.14–5.80)
RDW: 12.1 % (ref 11.6–15.4)
WBC: 12.5 x10E3/uL — ABNORMAL HIGH (ref 3.4–10.8)

## 2023-09-17 LAB — CMP14+EGFR
ALT: 130 IU/L — AB (ref 0–44)
AST: 85 IU/L — AB (ref 0–40)
Albumin: 4.5 g/dL (ref 4.1–5.1)
Alkaline Phosphatase: 92 IU/L (ref 44–121)
BUN/Creatinine Ratio: 12 (ref 9–20)
BUN: 12 mg/dL (ref 6–24)
Bilirubin Total: 0.8 mg/dL (ref 0.0–1.2)
CO2: 21 mmol/L (ref 20–29)
Calcium: 9.6 mg/dL (ref 8.7–10.2)
Chloride: 103 mmol/L (ref 96–106)
Creatinine, Ser: 0.98 mg/dL (ref 0.76–1.27)
Globulin, Total: 3.2 g/dL (ref 1.5–4.5)
Glucose: 92 mg/dL (ref 70–99)
Potassium: 4.3 mmol/L (ref 3.5–5.2)
Sodium: 142 mmol/L (ref 134–144)
Total Protein: 7.7 g/dL (ref 6.0–8.5)
eGFR: 97 mL/min/1.73 (ref >59)

## 2023-09-17 LAB — LIPID PANEL
Chol/HDL Ratio: 6.9 ratio — AB (ref 0.0–5.0)
Cholesterol, Total: 255 mg/dL — AB (ref 100–199)
HDL: 37 mg/dL — AB (ref >39)
LDL Chol Calc (NIH): 196 mg/dL — AB (ref 0–99)
Triglycerides: 121 mg/dL (ref 0–149)
VLDL Cholesterol Cal: 22 mg/dL (ref 5–40)

## 2023-09-17 LAB — PSA, TOTAL AND FREE
PSA, Free Pct: 36.7
PSA, Free: 0.11 ng/mL
Prostate Specific Ag, Serum: 0.3 ng/mL (ref 0.0–4.0)

## 2023-09-17 LAB — VITAMIN D 25 HYDROXY (VIT D DEFICIENCY, FRACTURES): Vit D, 25-Hydroxy: 29.7 ng/mL — AB (ref 30.0–100.0)

## 2023-09-22 ENCOUNTER — Other Ambulatory Visit: Payer: Self-pay | Admitting: Family Medicine

## 2023-09-22 MED ORDER — PREGABALIN 75 MG PO CAPS: 75.0000 mg | ORAL_CAPSULE | Freq: Every day | ORAL | 1 refills | Status: AC

## 2023-09-22 MED ORDER — PANTOPRAZOLE SODIUM 40 MG PO TBEC: 40.0000 mg | DELAYED_RELEASE_TABLET | Freq: Every day | ORAL | 1 refills | Status: DC

## 2023-09-22 MED ORDER — PREGABALIN 75 MG PO CAPS: 75.0000 mg | ORAL_CAPSULE | Freq: Every day | ORAL | 1 refills | Status: DC

## 2023-09-22 MED ORDER — ATORVASTATIN CALCIUM 40 MG PO TABS
40.0000 mg | ORAL_TABLET | Freq: Every day | ORAL | 3 refills | Status: AC
Start: 2023-09-22 — End: ?

## 2023-09-22 NOTE — Telephone Encounter (Signed)
 RABEprazole (ACIPHEX) 20 MG tablet        Changed from: pantoprazole  (PROTONIX ) 40 MG tablet   Pharmacy comment: Alternative Requested:DRUG NOT COVERED; PLEASE CONSIDER ALTERNATIVE, CONTACT INSURANCE OR ADVISE IF PT SHOULD USE DISCOUNT CARD.   All Pharmacy Suggested Alternatives:  RABEprazole (ACIPHEX) 20 MG tablet lansoprazole  (PREVACID ) 30 MG capsule esomeprazole (NEXIUM) 20 MG capsule omeprazole  (PRILOSEC) 20 MG capsule

## 2023-09-22 NOTE — Addendum Note (Signed)
 Addended by: Khaleel Beckom D on: 09/22/2023 08:26 AM   Modules accepted: Orders

## 2023-09-22 NOTE — Telephone Encounter (Signed)
 Refill failed. resent

## 2023-09-23 ENCOUNTER — Other Ambulatory Visit: Payer: Self-pay | Admitting: Family Medicine

## 2023-09-23 NOTE — Telephone Encounter (Signed)
 pantoprazole  (PROTONIX ) 40 MG tablet        Changed from: lansoprazole  (PREVACID ) 30 MG capsule   Pharmacy comment: Alternative Requested:THE PRESCRIBED MEDICATION IS NOT COVERED BY INSURANCE. PLEASE CONSIDER CHANGING TO ONE OF THE SUGGESTED COVERED ALTERNATIVES.   All Pharmacy Suggested Alternatives:  pantoprazole  (PROTONIX ) 40 MG tablet omeprazole  (PRILOSEC) 20 MG capsule RABEprazole (ACIPHEX) 20 MG tablet esomeprazole (NEXIUM) 20 MG capsule

## 2023-09-23 NOTE — Telephone Encounter (Signed)
 lansoprazole  (PREVACID ) 30 MG capsule        Changed from: RABEprazole (ACIPHEX) 20 MG tablet   Pharmacy comment: Alternative Requested:IDK WHY BUT INS ISNT COVERING THIS ONE EITHER.   All Pharmacy Suggested Alternatives:  lansoprazole  (PREVACID ) 30 MG capsule esomeprazole (NEXIUM) 20 MG capsule omeprazole  (PRILOSEC) 20 MG capsule

## 2023-10-19 ENCOUNTER — Telehealth: Payer: Self-pay | Admitting: Family Medicine

## 2023-10-19 ENCOUNTER — Other Ambulatory Visit: Payer: Self-pay | Admitting: Family Medicine

## 2023-10-19 DIAGNOSIS — Z1211 Encounter for screening for malignant neoplasm of colon: Secondary | ICD-10-CM

## 2023-10-19 NOTE — Telephone Encounter (Signed)
 Copied from CRM [id number]. Topic: Clinical - Request for Lab/Test Order >> Oct 19, 2023  2:51 PM Larissa S wrote: Reason for CRM: Patient is requesting an order for Cologuard to be placed as discussed during his last appointment. Patient requesting a callback from PCP/nurse.

## 2023-10-19 NOTE — Telephone Encounter (Signed)
 Cologuard order placed, please notify pt.

## 2023-10-20 NOTE — Telephone Encounter (Signed)
 Left detailed message making patient aware that Cologuard has been ordered and advised patient to call back if he had any questions.

## 2024-03-21 ENCOUNTER — Ambulatory Visit: Payer: Self-pay | Admitting: Family Medicine
